# Patient Record
Sex: Male | Born: 1970 | Hispanic: No | Marital: Married | Smoking: Current every day smoker
Health system: Southern US, Community
[De-identification: ages and names within clinical notes are randomized; demographics above are authoritative.]

## PROBLEM LIST (undated history)

## (undated) DIAGNOSIS — F17200 Nicotine dependence, unspecified, uncomplicated: Secondary | ICD-10-CM

## (undated) HISTORY — DX: Nicotine dependence, unspecified, uncomplicated: F17.200

## (undated) HISTORY — PX: BACK SURGERY: SHX140

---

## 1998-01-03 ENCOUNTER — Emergency Department (HOSPITAL_COMMUNITY): Admission: EM | Admit: 1998-01-03 | Discharge: 1998-01-03 | Payer: Self-pay | Admitting: Emergency Medicine

## 1998-01-07 ENCOUNTER — Emergency Department (HOSPITAL_COMMUNITY): Admission: EM | Admit: 1998-01-07 | Discharge: 1998-01-07 | Payer: Self-pay | Admitting: Emergency Medicine

## 1998-01-14 ENCOUNTER — Emergency Department (HOSPITAL_COMMUNITY): Admission: EM | Admit: 1998-01-14 | Discharge: 1998-01-14 | Payer: Self-pay | Admitting: Emergency Medicine

## 1999-10-09 ENCOUNTER — Encounter: Admission: RE | Admit: 1999-10-09 | Discharge: 1999-10-09 | Payer: Self-pay | Admitting: Family Medicine

## 1999-10-09 ENCOUNTER — Encounter: Payer: Self-pay | Admitting: Family Medicine

## 2002-01-30 ENCOUNTER — Encounter: Payer: Self-pay | Admitting: *Deleted

## 2002-01-30 ENCOUNTER — Ambulatory Visit (HOSPITAL_COMMUNITY): Admission: RE | Admit: 2002-01-30 | Discharge: 2002-01-30 | Payer: Self-pay | Admitting: *Deleted

## 2005-04-03 ENCOUNTER — Ambulatory Visit (HOSPITAL_COMMUNITY): Admission: RE | Admit: 2005-04-03 | Discharge: 2005-04-03 | Payer: Self-pay | Admitting: Orthopedic Surgery

## 2005-04-24 ENCOUNTER — Ambulatory Visit (HOSPITAL_COMMUNITY): Admission: RE | Admit: 2005-04-24 | Discharge: 2005-04-26 | Payer: Self-pay | Admitting: Orthopedic Surgery

## 2010-07-14 ENCOUNTER — Ambulatory Visit: Payer: Self-pay | Admitting: Cardiology

## 2010-11-07 NOTE — Op Note (Signed)
NAME:  Jared Davis, Jared Davis                       ACCOUNT NO.:  [id number]   MEDICAL RECORD NO.:  [id number]          PATIENT TYPE:  OIB   LOCATION:  1510                         FACILITY:  [facility]   PHYSICIAN:  Georges Lynch. 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   SURGEON:  Georges Lynch. Darrelyn Hillock, M.D.   ASSISTANT:  Marlowe Kays, M.D.   OPERATION:  Hemilaminectomy and microdiskectomy at L4-5 on the left.   DESCRIPTION OF PROCEDURE:  Under general anesthesia, a routine orthopedic  prep and draping of the lower back was carried out. The patient had 1 gram  of IV Ancef. Two needles were placed in the back for localization purposes,  Trai x-ray was taken. At this time, Scot incision was made over the L4-5  interspace, bleeders identified and cauterized. The muscle was stripped from  the lamina and spinous process in the usual fashion. Two markers were placed  in the back, another x-ray was taken. Once we verified that we were in the  L4-5 space on the left, we then carried out a hemilaminectomy in the usual  fashion. We brought in the microscope,  removed the ligamentum flavum,  gently retracted the dura, identified the L5 root cauterized lateral recess  veins and then noted a large herniated disk at 4-5 on the left. A cruciate  incision was made into the disk in the posterior longitudinal ligament and  immediately disk material came out. We then went in and completed the  diskectomy. We made multiple passes into the disk space and into the  subligamentous area to  make sure there were no other free fragments and  there were none. I thoroughly irrigated out the area. We did a nice  foraminotomy as well. We were able to pass the hockey stick out the foramen  and all about the area to make sure there were no other specific disk  fragments. We thoroughly irrigated the area, cleaned the area out well and  then loosely applied some thrombin-soaked Gelfoam. We closed the wound in  layers in the usual fashion except the very deep proximal part of the wound  was partially left open for drainage purposes. The skin was closed with  metal staples and a sterile Neosporin dressing was applied. At the end of  the case, he had 30 mg of Toradol IV.           ______________________________  Georges Lynch. Darrelyn Hillock, M.D.     RAG/MEDQ  D:  04/24/2005  T:  04/24/2005  Job:  [id number]

## 2017-11-09 ENCOUNTER — Other Ambulatory Visit: Payer: Self-pay

## 2017-11-09 ENCOUNTER — Emergency Department (HOSPITAL_COMMUNITY): Payer: Commercial Managed Care - PPO

## 2017-11-09 ENCOUNTER — Emergency Department (HOSPITAL_COMMUNITY): Payer: Commercial Managed Care - PPO | Admitting: Certified Registered"

## 2017-11-09 ENCOUNTER — Encounter (HOSPITAL_COMMUNITY)
Admission: EM | Disposition: A | Discharge status: Home / Self Care | Payer: Self-pay | Source: Home / Self Care | Attending: Emergency Medicine

## 2017-11-09 ENCOUNTER — Observation Stay (HOSPITAL_COMMUNITY)
Admission: EM | Admit: 2017-11-09 | Discharge: 2017-11-11 | Disposition: A | Discharge status: Home / Self Care | Payer: Commercial Managed Care - PPO | Attending: Orthopedic Surgery | Admitting: Orthopedic Surgery

## 2017-11-09 ENCOUNTER — Encounter (HOSPITAL_COMMUNITY): Payer: Self-pay | Admitting: *Deleted

## 2017-11-09 DIAGNOSIS — Z23 Encounter for immunization: Secondary | ICD-10-CM | POA: Insufficient documentation

## 2017-11-09 DIAGNOSIS — W312XXA Contact with powered woodworking and forming machines, initial encounter: Secondary | ICD-10-CM | POA: Diagnosis not present

## 2017-11-09 DIAGNOSIS — S62394B Other fracture of fourth metacarpal bone, right hand, initial encounter for open fracture: Secondary | ICD-10-CM | POA: Diagnosis not present

## 2017-11-09 DIAGNOSIS — S62396B Other fracture of fifth metacarpal bone, right hand, initial encounter for open fracture: Secondary | ICD-10-CM | POA: Diagnosis not present

## 2017-11-09 DIAGNOSIS — S6291XA Unspecified fracture of right wrist and hand, initial encounter for closed fracture: Secondary | ICD-10-CM | POA: Diagnosis present

## 2017-11-09 DIAGNOSIS — Y92009 Unspecified place in unspecified non-institutional (private) residence as the place of occurrence of the external cause: Secondary | ICD-10-CM | POA: Diagnosis not present

## 2017-11-09 DIAGNOSIS — F17200 Nicotine dependence, unspecified, uncomplicated: Secondary | ICD-10-CM | POA: Diagnosis not present

## 2017-11-09 DIAGNOSIS — Y9389 Activity, other specified: Secondary | ICD-10-CM | POA: Insufficient documentation

## 2017-11-09 DIAGNOSIS — S62390B Other fracture of second metacarpal bone, right hand, initial encounter for open fracture: Principal | ICD-10-CM | POA: Insufficient documentation

## 2017-11-09 DIAGNOSIS — S6991XA Unspecified injury of right wrist, hand and finger(s), initial encounter: Secondary | ICD-10-CM | POA: Diagnosis present

## 2017-11-09 DIAGNOSIS — S61011A Laceration without foreign body of right thumb without damage to nail, initial encounter: Secondary | ICD-10-CM | POA: Diagnosis not present

## 2017-11-09 DIAGNOSIS — S62392B Other fracture of third metacarpal bone, right hand, initial encounter for open fracture: Secondary | ICD-10-CM | POA: Diagnosis not present

## 2017-11-09 HISTORY — PX: I & D EXTREMITY: SHX5045

## 2017-11-09 SURGERY — IRRIGATION AND DEBRIDEMENT EXTREMITY
Anesthesia: General | Site: Hand | Laterality: Right

## 2017-11-09 MED ORDER — SODIUM CHLORIDE 0.9 % IR SOLN
Status: DC | PRN
  Administered 2017-11-09 – 2017-11-10 (×4): 3000 mL

## 2017-11-09 MED ORDER — FENTANYL CITRATE (PF) 100 MCG/2ML IJ SOLN
INTRAMUSCULAR | Status: DC | PRN
  Administered 2017-11-09: 50 ug via INTRAVENOUS
  Administered 2017-11-09: 100 ug via INTRAVENOUS
  Administered 2017-11-09 – 2017-11-10 (×2): 50 ug via INTRAVENOUS

## 2017-11-09 MED ORDER — BUPIVACAINE HCL (PF) 0.25 % IJ SOLN
INTRAMUSCULAR | Status: AC
  Filled 2017-11-09: qty 30

## 2017-11-09 MED ORDER — DEXAMETHASONE SODIUM PHOSPHATE 10 MG/ML IJ SOLN
INTRAMUSCULAR | Status: DC | PRN
  Administered 2017-11-09: 10 mg via INTRAVENOUS

## 2017-11-09 MED ORDER — PROPOFOL 10 MG/ML IV BOLUS
INTRAVENOUS | Status: AC
  Filled 2017-11-09: qty 20

## 2017-11-09 MED ORDER — LIDOCAINE HCL (CARDIAC) PF 100 MG/5ML IV SOSY
PREFILLED_SYRINGE | INTRAVENOUS | Status: DC | PRN
  Administered 2017-11-09: 50 mg via INTRAVENOUS

## 2017-11-09 MED ORDER — MIDAZOLAM HCL 5 MG/5ML IJ SOLN
INTRAMUSCULAR | Status: DC | PRN
  Administered 2017-11-09: 2 mg via INTRAVENOUS

## 2017-11-09 MED ORDER — CEFAZOLIN SODIUM-DEXTROSE 1-4 GM/50ML-% IV SOLN
1.0000 g | Freq: Once | INTRAVENOUS | Status: AC
  Administered 2017-11-09: 1 g via INTRAVENOUS
  Filled 2017-11-09: qty 50

## 2017-11-09 MED ORDER — SUCCINYLCHOLINE CHLORIDE 20 MG/ML IJ SOLN
INTRAMUSCULAR | Status: DC | PRN
  Administered 2017-11-09: 120 mg via INTRAVENOUS

## 2017-11-09 MED ORDER — 0.9 % SODIUM CHLORIDE (POUR BTL) OPTIME
TOPICAL | Status: DC | PRN
  Administered 2017-11-09: 1000 mL

## 2017-11-09 MED ORDER — PHENYLEPHRINE HCL 10 MG/ML IJ SOLN
INTRAMUSCULAR | Status: DC | PRN
  Administered 2017-11-09: 80 ug via INTRAVENOUS
  Administered 2017-11-09: 40 ug via INTRAVENOUS
  Administered 2017-11-09: 80 ug via INTRAVENOUS
  Administered 2017-11-09: 40 ug via INTRAVENOUS

## 2017-11-09 MED ORDER — MIDAZOLAM HCL 2 MG/2ML IJ SOLN
INTRAMUSCULAR | Status: AC
  Filled 2017-11-09: qty 2

## 2017-11-09 MED ORDER — CEFAZOLIN SODIUM-DEXTROSE 1-4 GM/50ML-% IV SOLN
INTRAVENOUS | Status: DC | PRN
  Administered 2017-11-09: 1 g via INTRAVENOUS

## 2017-11-09 MED ORDER — TETANUS-DIPHTH-ACELL PERTUSSIS 5-2.5-18.5 LF-MCG/0.5 IM SUSP
0.5000 mL | Freq: Once | INTRAMUSCULAR | Status: AC
  Administered 2017-11-09: 0.5 mL via INTRAMUSCULAR
  Filled 2017-11-09: qty 0.5

## 2017-11-09 MED ORDER — LACTATED RINGERS IV SOLN
INTRAVENOUS | Status: DC | PRN
  Administered 2017-11-09 (×2): via INTRAVENOUS

## 2017-11-09 MED ORDER — FENTANYL CITRATE (PF) 250 MCG/5ML IJ SOLN
INTRAMUSCULAR | Status: AC
  Filled 2017-11-09: qty 5

## 2017-11-09 MED ORDER — PROPOFOL 10 MG/ML IV BOLUS
INTRAVENOUS | Status: DC | PRN
  Administered 2017-11-09: 140 mg via INTRAVENOUS

## 2017-11-09 MED ORDER — EPHEDRINE SULFATE 50 MG/ML IJ SOLN
INTRAMUSCULAR | Status: DC | PRN
  Administered 2017-11-09 (×2): 5 mg via INTRAVENOUS

## 2017-11-09 MED ORDER — ONDANSETRON HCL 4 MG/2ML IJ SOLN
INTRAMUSCULAR | Status: DC | PRN
  Administered 2017-11-09: 4 mg via INTRAVENOUS

## 2017-11-09 SURGICAL SUPPLY — 77 items
BANDAGE ACE 4X5 VEL STRL LF (GAUZE/BANDAGES/DRESSINGS) ×3 IMPLANT
BANDAGE ELASTIC 4 LF NS (GAUZE/BANDAGES/DRESSINGS) ×2 IMPLANT
BIT DRILL 2X3.5 HAND QK RELEAS (BIT) IMPLANT
BIT DRILL 2X3.5 QUICK RELEASE (BIT) ×3
BIT DRILL ACUTRAK 2 MINI (BIT) IMPLANT
BIT DRILL LNG QR ACUTRAK 2 (BIT) ×2 IMPLANT
BIT DRILL MINI LNG ACUTRAK 2 (BIT) IMPLANT
BNDG CMPR MED 5X4 ELC HKLP NS (GAUZE/BANDAGES/DRESSINGS) ×1
BNDG CONFORM 2 STRL LF (GAUZE/BANDAGES/DRESSINGS) IMPLANT
BNDG GAUZE ELAST 4 BULKY (GAUZE/BANDAGES/DRESSINGS) ×5 IMPLANT
CORDS BIPOLAR (ELECTRODE) ×3 IMPLANT
COVER SURGICAL LIGHT HANDLE (MISCELLANEOUS) ×3 IMPLANT
CUFF TOURNIQUET SINGLE 18IN (TOURNIQUET CUFF) ×3 IMPLANT
CUFF TOURNIQUET SINGLE 24IN (TOURNIQUET CUFF) IMPLANT
DRILL ACUTRAK 2 MINI (BIT) ×6
DRILL MINI LNG ACUTRAK 2 (BIT) ×3
DRSG ADAPTIC 3X8 NADH LF (GAUZE/BANDAGES/DRESSINGS) ×1 IMPLANT
GAUZE SPONGE 4X4 12PLY STRL (GAUZE/BANDAGES/DRESSINGS) ×3 IMPLANT
GAUZE XEROFORM 1X8 LF (GAUZE/BANDAGES/DRESSINGS) ×3 IMPLANT
GAUZE XEROFORM 5X9 LF (GAUZE/BANDAGES/DRESSINGS) ×2 IMPLANT
GLOVE BIOGEL M 8.0 STRL (GLOVE) ×1 IMPLANT
GLOVE SS BIOGEL STRL SZ 8 (GLOVE) ×1 IMPLANT
GLOVE SUPERSENSE BIOGEL SZ 8 (GLOVE) ×2
GOWN STRL REUS W/ TWL LRG LVL3 (GOWN DISPOSABLE) ×1 IMPLANT
GOWN STRL REUS W/ TWL XL LVL3 (GOWN DISPOSABLE) ×2 IMPLANT
GOWN STRL REUS W/TWL LRG LVL3 (GOWN DISPOSABLE) ×3
GOWN STRL REUS W/TWL XL LVL3 (GOWN DISPOSABLE) ×3
GUIDEWIRE ORTHO 0.054X7 SS (WIRE) ×2 IMPLANT
GUIDEWIRE ORTHO MINI ACTK .045 (WIRE) ×4 IMPLANT
KIT BASIN OR (CUSTOM PROCEDURE TRAY) ×3 IMPLANT
KIT TURNOVER KIT B (KITS) ×3 IMPLANT
MANIFOLD NEPTUNE II (INSTRUMENTS) ×3 IMPLANT
NDL HYPO 25GX1X1/2 BEV (NEEDLE) IMPLANT
NEEDLE HYPO 25GX1X1/2 BEV (NEEDLE) ×3 IMPLANT
NS IRRIG 1000ML POUR BTL (IV SOLUTION) ×3 IMPLANT
PACK ORTHO EXTREMITY (CUSTOM PROCEDURE TRAY) ×3 IMPLANT
PAD ARMBOARD 7.5X6 YLW CONV (MISCELLANEOUS) ×5 IMPLANT
PAD CAST 4YDX4 CTTN HI CHSV (CAST SUPPLIES) ×1 IMPLANT
PADDING CAST COTTON 4X4 STRL (CAST SUPPLIES) ×3
PADDING CAST SYNTHETIC 4 (CAST SUPPLIES) ×2
PADDING CAST SYNTHETIC 4X4 STR (CAST SUPPLIES) IMPLANT
PLATE STRAIGHT 10HOLE 1.3MM (Plate) ×2 IMPLANT
PUTTY DBM STAGRAFT PLUS 5CC (Putty) ×2 IMPLANT
SCREW ACUTRAK 2 MINI 30MM (Screw) ×2 IMPLANT
SCREW ACUTRAK 2 STD 32MM (Screw) ×2 IMPLANT
SCREW BONE LAG 2.3X10MM HEXA (Screw) IMPLANT
SCREW BONE LAG 2.3X9MM HEXAQ (Screw) IMPLANT
SCREW BONE LOCK 2.3X14MM HEXA (Screw) IMPLANT
SCREW LAG 2.3X10MM (Screw) ×3 IMPLANT
SCREW LAG 2.3X9MM (Screw) ×3 IMPLANT
SCREW LOCK 2.3X14 (Screw) ×3 IMPLANT
SCREW LOCK HEX MULTI 2.3X10 (Screw) ×2 IMPLANT
SCREW LOCK HEX MULTI 2.3X11 (Screw) ×2 IMPLANT
SCREW LOCK HEX MULTI 2.3X12 (Screw) ×2 IMPLANT
SCREW LOCK HEX MULTI 2.3X13 (Screw) ×2 IMPLANT
SCREW LOCK HEX MULTI 2.3X8 (Screw) ×2 IMPLANT
SCRUB BETADINE 4OZ XXX (MISCELLANEOUS) ×3 IMPLANT
SET CYSTO W/LG BORE CLAMP LF (SET/KITS/TRAYS/PACK) ×3 IMPLANT
SOL PREP POV-IOD 4OZ 10% (MISCELLANEOUS) ×3 IMPLANT
SPONGE LAP 4X18 X RAY DECT (DISPOSABLE) ×3 IMPLANT
STOCKINETTE 4X48 STRL (DRAPES) ×2 IMPLANT
SUT CHROMIC 4 0 P 3 18 (SUTURE) ×4 IMPLANT
SUT FIBER WIRE 4.0 (SUTURE) ×4 IMPLANT
SUT FIBERWIRE 3-0 18 DIAM 3/8 (SUTURE) ×3
SUT FIBERWIRE 3-0 18 TAPR NDL (SUTURE) ×6
SUT PROLENE 4 0 P 3 18 (SUTURE) ×4 IMPLANT
SUT PROLENE 4 0 PS 2 18 (SUTURE) ×10 IMPLANT
SUTURE FIBERWR 3-0 18 DIAM 3/8 (SUTURE) IMPLANT
SUTURE FIBERWR 3-0 18 TAPR NDL (SUTURE) IMPLANT
SWAB CULTURE ESWAB REG 1ML (MISCELLANEOUS) IMPLANT
SYR CONTROL 10ML LL (SYRINGE) ×2 IMPLANT
TOWEL OR 17X24 6PK STRL BLUE (TOWEL DISPOSABLE) ×3 IMPLANT
TOWEL OR 17X26 10 PK STRL BLUE (TOWEL DISPOSABLE) ×3 IMPLANT
TUBE CONNECTING 12'X1/4 (SUCTIONS) ×1
TUBE CONNECTING 12X1/4 (SUCTIONS) ×2 IMPLANT
WATER STERILE IRR 1000ML POUR (IV SOLUTION) ×1 IMPLANT
YANKAUER SUCT BULB TIP NO VENT (SUCTIONS) ×3 IMPLANT

## 2017-11-09 NOTE — Anesthesia Preprocedure Evaluation (Addendum)
Anesthesia Evaluation  Patient identified by MRN, date of birth, ID band Patient awake    Reviewed: Allergy & Precautions, NPO status , Patient's Chart, lab work & pertinent test results  Airway Mallampati: II  TM Distance: >3 FB Neck ROM: Full    Dental  (+) Teeth Intact, Dental Advisory Given   Pulmonary neg pulmonary ROS, Current Smoker,    breath sounds clear to auscultation       Cardiovascular negative cardio ROS   Rhythm:Regular Rate:Normal     Neuro/Psych negative neurological ROS  negative psych ROS   GI/Hepatic negative GI ROS, Neg liver ROS,   Endo/Other  negative endocrine ROS  Renal/GU negative Renal ROS     Musculoskeletal negative musculoskeletal ROS (+)   Abdominal Normal abdominal exam  (+)   Peds  Hematology negative hematology ROS (+)   Anesthesia Other Findings   Reproductive/Obstetrics                            No results found for: WBC, HGB, HCT, MCV, PLT   Anesthesia Physical Anesthesia Plan  ASA: II  Anesthesia Plan: General   Post-op Pain Management:    Induction: Intravenous, Rapid sequence and Cricoid pressure planned  PONV Risk Score and Plan: Ondansetron, Dexamethasone and Midazolam  Airway Management Planned: Oral ETT  Additional Equipment: None  Intra-op Plan:   Post-operative Plan: Extubation in OR  Informed Consent: I have reviewed the patients History and Physical, chart, labs and discussed the procedure including the risks, benefits and alternatives for the proposed anesthesia with the patient or authorized representative who has indicated his/her understanding and acceptance.   Dental advisory given  Plan Discussed with: CRNA  Anesthesia Plan Comments:         Anesthesia Quick Evaluation

## 2017-11-09 NOTE — H&P (Signed)
Jared Davis is Jared Davis 47 y.o. male.   Chief Complaint: Table saw injury right hand HPI: Patient is a 47 year old male who presents with a table saw injury to his right hand.  He notes no other injury.  He is here with his family.  He was at home working with the saw.  He is right-hand dominant.  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  Patient and I discussed all issues at length.  Patient notes no other injury.  I reviewed these issues with him at length and the relevant findings.  At present juncture I discussed him the relevant issues.  I discussed with him our plans for irrigation debridement and repair is necessary.  History reviewed. No pertinent past medical history.  History reviewed. No pertinent surgical history.  Patient has a history of back surgery greater than 10 years ago.  No family history on file. Social History:  has no tobacco, alcohol, and drug history on file.  Allergies: No Known Allergies   (Not in a hospital admission)  No results found for this or any previous visit (from the past 48 hour(s)). No results found.  Review of Systems  Respiratory: Negative.   Cardiovascular: Negative.   Gastrointestinal: Negative.   Genitourinary: Negative.     Blood pressure 113/69, pulse 76, temperature 98.2 F (36.8 C), resp. rate 16, SpO2 94 %. Physical Exam  Pleasant male with a right hand injury has bloodsoaked bandage with severe laceration across the dorsal aspect of his hand.  Patient and I have discussed this at length.  He is going to need acute/urgent surgical debridement.  It is difficult to evaluate the flexion and extension capabilities due to the severe pain.  I reviewed this with him at length and the findings.  Given all the issues I would recommend Sadie operative exploration and repair is necessary as well as intraoperative fluoroscopy to fix any osteochondral injury at the time of surgical intervention.  He understands this  and desires to proceed.  The patient is alert and oriented in no acute distress. The patient complains of pain in the affected upper extremity.  The patient is noted to have a normal HEENT exam. Lung fields show equal chest expansion and no shortness of breath. Abdomen exam is nontender without distention. Lower extremity examination does not show any fracture dislocation or blood clot symptoms. Pelvis is stable and the neck and back are stable and nontender. Assessment/Plan We will plan for irrigation debridement repair structures as necessary in hopes to salvage his hand.  We are planning surgery for your upper extremity. The risk and benefits of surgery to include risk of bleeding, infection, anesthesia,  damage to normal structures and failure of the surgery to accomplish its intended goals of relieving symptoms and restoring function have been discussed in detail. With this in mind we plan to proceed. I have specifically discussed with the patient the pre-and postoperative regime and the dos and don'ts and risk and benefits in great detail. Risk and benefits of surgery also include risk of dystrophy(CRPS), chronic nerve pain, failure of the healing process to go onto completion and other inherent risks of surgery The relavent the pathophysiology of the disease/injury process, as well as the alternatives for treatment and postoperative course of action has been discussed in great detail with the patient who desires to proceed.  We will do everything in our power to help you (the patient) restore function to the upper extremity. It  is a pleasure to see this patient today.   [name] III, MD 11/09/2017, 10:26 PM

## 2017-11-09 NOTE — ED Provider Notes (Signed)
[facility] EMERGENCY DEPARTMENT Provider Note   CSN: [id number] Arrival date & time: 11/09/17  2104     History   Chief Complaint Chief Complaint  Patient presents with  . Laceration    HPI Jared Davis is a 47 y.o. male.  Pt was using saw when he cut the dorsum of his right hand about 1 hour prior to arrival. Complex laceration with bones and tendons involved per EMS.  The history is provided by the patient.  Hand Injury   The incident occurred less than 1 hour ago. The incident occurred at home. Injury mechanism: wood saw. The pain is present in the right hand. The quality of the pain is described as throbbing. The pain is severe. The pain has been constant since the incident. Pertinent negatives include no fever. It is unknown if a foreign body is present. The symptoms are aggravated by movement. Treatments tried: Fentanyl with EMS. The treatment provided mild relief.    History reviewed. No pertinent past medical history.  There are no active problems to display for this patient.   History reviewed. No pertinent surgical history.      Home Medications    Prior to Admission medications   Not on File    Family History No family history on file.  Social History Social History   Tobacco Use  . Smoking status: Not on file  Substance Use Topics  . Alcohol use: Not on file  . Drug use: Not on file     Allergies   Patient has no known allergies.   Review of Systems Review of Systems  Constitutional: Negative for chills and fever.  HENT: Negative for ear pain and sore throat.   Eyes: Negative for pain and visual disturbance.  Respiratory: Negative for cough and shortness of breath.   Cardiovascular: Negative for chest pain and palpitations.  Gastrointestinal: Negative for abdominal pain and vomiting.  Genitourinary: Negative for dysuria and hematuria.  Musculoskeletal: Positive for arthralgias. Negative for back pain.  Skin: Positive  for wound. Negative for color change and rash.  Neurological: Negative for seizures and syncope.  All other systems reviewed and are negative.    Physical Exam Updated Vital Signs BP 113/69   Pulse 76   Temp 98.2 F (36.8 C)   Resp 16   SpO2 94%   Physical Exam  Constitutional: He appears well-developed and well-nourished.  HENT:  Head: Normocephalic and atraumatic.  Eyes: Conjunctivae are normal.  Neck: Neck supple.  Cardiovascular: Normal rate and regular rhythm.  No murmur heard. Pulmonary/Chest: Effort normal and breath sounds normal. No respiratory distress.  Abdominal: Soft. There is no tenderness.  Musculoskeletal: He exhibits tenderness and deformity. He exhibits no edema.  Complex injury to the right hand.  There is a large laceration extending from the second MCP through the fifth MCP of the dorsum of the hand.  It appears that all of the extensor tendons of the second through fifth digits are lacerated.  The fourth and fifth metacarpal heads are exposed.  There is a small amount of blood oozing which is controlled with direct pressure.  Neurological: He is alert.  Skin: Skin is warm and dry.  Psychiatric: He has a normal mood and affect.  Nursing note and vitals reviewed.    ED Treatments / Results  Labs (all labs ordered are listed, but only abnormal results are displayed) Labs Reviewed - No data to display  EKG None  Radiology Dg Hand Complete Right  Result Date: 11/09/2017 CLINICAL DATA:  Power tool injury to right hand. EXAM: RIGHT HAND - COMPLETE 3+ VIEW COMPARISON:  None. FINDINGS: Bandaging over the right hand obscures fine osseous detail. Within that limitation, there are comminuted fractures of the heads of the right second and fifth metacarpals. There is also probably a fracture of the head of the third metacarpal, though it is less clearly visible. Fourth metacarpal appears intact. IMPRESSION: Comminuted fractures of the heads of the right second and  fifth metacarpals. Suspected fracture of the head of the third metacarpal. Electronically Signed   By: [name] M.D.   On: 11/09/2017 22:32    Procedures Procedures (including critical care time)  Medications Ordered in ED Medications  sodium chloride irrigation 0.9 % (3,000 mLs  Given 11/09/17 2308)  0.9 % irrigation (POUR BTL) (1,000 mLs Irrigation Given 11/09/17 2254)  ceFAZolin (ANCEF) IVPB 1 g/50 mL premix (1 g Intravenous New Bag/Given 11/09/17 2148)  Tdap (BOOSTRIX) injection 0.5 mL (0.5 mLs Intramuscular Given 11/09/17 2148)     Initial Impression / Assessment and Plan / ED Course  I have reviewed the triage vital signs and the nursing notes.  Pertinent labs & imaging results that were available during my care of the patient were reviewed by me and considered in my medical decision making (see chart for details).    Patient is a 47 year old male who presents with a complex right hand laceration that occurred with a fall.  He has Jamond open fracture to his right hand with multiple metacarpals exposed as well as multiple extensor tendon lacerations.  Tdap and Ancef given.  X-ray shows fractures of the heads of the right second and fifth metacarpals.  His capillary refill in all of his fingers on the right hand are less than 2 seconds.  He has sensation distally in all of his digits.  Hand surgery consulted.  They will take patient directly to the OR for definitive management.  Final Clinical Impressions(s) / ED Diagnoses   Final diagnoses:  Hand injuries, right, initial encounter    ED Discharge Orders    None       Lennette Bihari, MD 11/09/17 2348    Nira Conn, MD 11/10/17 2109

## 2017-11-09 NOTE — Anesthesia Procedure Notes (Addendum)
Procedure Name: Intubation Date/Time: 11/09/2017 10:39 PM Performed by: Babs Bertin, CRNA Pre-anesthesia Checklist: Patient identified, Emergency Drugs available, Suction available and Patient being monitored Patient Re-evaluated:Patient Re-evaluated prior to induction Oxygen Delivery Method: Circle System Utilized Preoxygenation: Pre-oxygenation with 100% oxygen Induction Type: IV induction, Rapid sequence and Cricoid Pressure applied Laryngoscope Size: Mac and 3 Grade View: Grade I Tube type: Oral Tube size: 7.5 mm Number of attempts: 1 Airway Equipment and Method: Stylet and Oral airway Placement Confirmation: ETT inserted through vocal cords under direct vision,  positive ETCO2 and breath sounds checked- equal and bilateral Secured at: 21 cm Tube secured with: Tape Dental Injury: Teeth and Oropharynx as per pre-operative assessment

## 2017-11-09 NOTE — ED Triage Notes (Signed)
Pt arrived by EMS after cutting his R hand with a power saw. Significant lac to posterior hand with bone and tendon exposure; little finger nearly amputated. Limited range of motion; bleeding controlled at present. Unknown last tetanus; fentanyl given enroute.

## 2017-11-10 ENCOUNTER — Encounter (HOSPITAL_COMMUNITY): Payer: Self-pay | Admitting: *Deleted

## 2017-11-10 ENCOUNTER — Other Ambulatory Visit: Payer: Self-pay

## 2017-11-10 DIAGNOSIS — S6291XA Unspecified fracture of right wrist and hand, initial encounter for closed fracture: Secondary | ICD-10-CM | POA: Diagnosis present

## 2017-11-10 LAB — BASIC METABOLIC PANEL
ANION GAP: 7 (ref 5–15)
BUN: 11 mg/dL (ref 6–20)
CO2: 28 mmol/L (ref 22–32)
Calcium: 8.5 mg/dL — ABNORMAL LOW (ref 8.9–10.3)
Chloride: 102 mmol/L (ref 101–111)
Creatinine, Ser: 0.71 mg/dL (ref 0.61–1.24)
GFR calc Af Amer: >60 mL/min (ref >60)
Glucose, Bld: 173 mg/dL — ABNORMAL HIGH (ref 65–99)
POTASSIUM: 4.1 mmol/L (ref 3.5–5.1)
Sodium: 137 mmol/L (ref 135–145)

## 2017-11-10 LAB — CBC WITH DIFFERENTIAL/PLATELET
Abs Immature Granulocytes: 0.1 10*3/uL (ref 0.0–0.1)
BASOS PCT: 0 %
Basophils Absolute: 0 10*3/uL (ref 0.0–0.1)
EOS ABS: 0 10*3/uL (ref 0.0–0.7)
EOS PCT: 0 %
HCT: 39.2 % (ref 39.0–52.0)
Hemoglobin: 12.8 g/dL — ABNORMAL LOW (ref 13.0–17.0)
IMMATURE GRANULOCYTES: 0 %
Lymphocytes Relative: 6 %
Lymphs Abs: 0.8 10*3/uL (ref 0.7–4.0)
MCH: 27.9 pg (ref 26.0–34.0)
MCHC: 32.7 g/dL (ref 30.0–36.0)
MCV: 85.6 fL (ref 78.0–100.0)
Monocytes Absolute: 0.1 10*3/uL (ref 0.1–1.0)
Monocytes Relative: 1 %
NEUTROS PCT: 93 %
Neutro Abs: 11.5 10*3/uL — ABNORMAL HIGH (ref 1.7–7.7)
PLATELETS: 352 10*3/uL (ref 150–400)
RBC: 4.58 MIL/uL (ref 4.22–5.81)
RDW: 14.5 % (ref 11.5–15.5)
WBC: 12.5 10*3/uL — AB (ref 4.0–10.5)

## 2017-11-10 MED ORDER — GENTAMICIN SULFATE 40 MG/ML IJ SOLN
400.0000 mg | INTRAVENOUS | Status: AC
  Administered 2017-11-10 – 2017-11-11 (×2): 400 mg via INTRAVENOUS
  Filled 2017-11-10 (×2): qty 10

## 2017-11-10 MED ORDER — OXYCODONE HCL 5 MG PO TABS
5.0000 mg | ORAL_TABLET | ORAL | Status: DC | PRN
  Administered 2017-11-10 – 2017-11-11 (×2): 10 mg via ORAL
  Filled 2017-11-10 (×2): qty 2

## 2017-11-10 MED ORDER — LACTATED RINGERS IV SOLN
INTRAVENOUS | Status: DC
  Administered 2017-11-10: 10:00:00 via INTRAVENOUS

## 2017-11-10 MED ORDER — PROMETHAZINE HCL 25 MG/ML IJ SOLN: 6.2500 mg | INTRAMUSCULAR | Status: DC | PRN

## 2017-11-10 MED ORDER — ONDANSETRON HCL 4 MG/2ML IJ SOLN: 4.0000 mg | Freq: Four times a day (QID) | INTRAMUSCULAR | Status: DC | PRN

## 2017-11-10 MED ORDER — LACTATED RINGERS IV SOLN: INTRAVENOUS | Status: DC

## 2017-11-10 MED ORDER — PNEUMOCOCCAL VAC POLYVALENT 25 MCG/0.5ML IJ INJ
0.5000 mL | INJECTION | INTRAMUSCULAR | Status: AC
  Administered 2017-11-11: 0.5 mL via INTRAMUSCULAR
  Filled 2017-11-10: qty 0.5

## 2017-11-10 MED ORDER — VITAMIN C 500 MG PO TABS
1000.0000 mg | ORAL_TABLET | Freq: Every day | ORAL | Status: DC
  Administered 2017-11-10 – 2017-11-11 (×2): 1000 mg via ORAL
  Filled 2017-11-10 (×2): qty 2

## 2017-11-10 MED ORDER — DOCUSATE SODIUM 100 MG PO CAPS
100.0000 mg | ORAL_CAPSULE | Freq: Two times a day (BID) | ORAL | Status: DC
  Administered 2017-11-10 – 2017-11-11 (×3): 100 mg via ORAL
  Filled 2017-11-10 (×3): qty 1

## 2017-11-10 MED ORDER — HYDROMORPHONE HCL 2 MG/ML IJ SOLN
INTRAMUSCULAR | Status: AC
Start: 2017-11-10 — End: 2017-11-10
  Filled 2017-11-10: qty 1

## 2017-11-10 MED ORDER — ONDANSETRON HCL 4 MG PO TABS: 4.0000 mg | ORAL_TABLET | Freq: Four times a day (QID) | ORAL | Status: DC | PRN

## 2017-11-10 MED ORDER — CEFAZOLIN SODIUM-DEXTROSE 1-4 GM/50ML-% IV SOLN
1.0000 g | Freq: Three times a day (TID) | INTRAVENOUS | Status: DC
  Administered 2017-11-10 – 2017-11-11 (×3): 1 g via INTRAVENOUS
  Filled 2017-11-10 (×4): qty 50

## 2017-11-10 MED ORDER — MEPERIDINE HCL 50 MG/ML IJ SOLN: 6.2500 mg | INTRAMUSCULAR | Status: DC | PRN

## 2017-11-10 MED ORDER — CEFAZOLIN SODIUM-DEXTROSE 1-4 GM/50ML-% IV SOLN
1.0000 g | INTRAVENOUS | Status: AC
  Administered 2017-11-10: 1 g via INTRAVENOUS
  Filled 2017-11-10: qty 50

## 2017-11-10 MED ORDER — MORPHINE SULFATE (PF) 2 MG/ML IV SOLN
1.0000 mg | INTRAVENOUS | Status: DC | PRN
  Administered 2017-11-10: 1 mg via INTRAVENOUS
  Filled 2017-11-10: qty 1

## 2017-11-10 MED ORDER — HYDROMORPHONE HCL 2 MG/ML IJ SOLN
0.2500 mg | INTRAMUSCULAR | Status: DC | PRN
  Administered 2017-11-10 (×2): 0.5 mg via INTRAVENOUS

## 2017-11-10 MED ORDER — METHOCARBAMOL 500 MG PO TABS
500.0000 mg | ORAL_TABLET | Freq: Four times a day (QID) | ORAL | Status: DC | PRN
  Administered 2017-11-10 – 2017-11-11 (×2): 500 mg via ORAL
  Filled 2017-11-10 (×2): qty 1

## 2017-11-10 MED ORDER — FAMOTIDINE 20 MG PO TABS: 20.0000 mg | ORAL_TABLET | Freq: Two times a day (BID) | ORAL | Status: DC | PRN

## 2017-11-10 MED ORDER — METHOCARBAMOL 1000 MG/10ML IJ SOLN: 500.0000 mg | Freq: Four times a day (QID) | INTRAVENOUS | Status: DC | PRN

## 2017-11-10 MED ORDER — PROMETHAZINE HCL 12.5 MG RE SUPP
12.5000 mg | Freq: Four times a day (QID) | RECTAL | Status: DC | PRN
  Filled 2017-11-10: qty 1

## 2017-11-10 MED ORDER — ALPRAZOLAM 0.5 MG PO TABS: 0.5000 mg | ORAL_TABLET | Freq: Four times a day (QID) | ORAL | Status: DC | PRN

## 2017-11-10 NOTE — Anesthesia Postprocedure Evaluation (Signed)
Anesthesia Post Note  Patient: Jared Davis  Procedure(s) Performed: IRRIGATION AND DEBRIDEMENT RIGHT HAND REPAIR AS NECESSARY (Right Hand)     Patient location during evaluation: PACU Anesthesia Type: General Level of consciousness: awake and alert Pain management: pain level controlled Vital Signs Assessment: post-procedure vital signs reviewed and stable Respiratory status: spontaneous breathing, nonlabored ventilation, respiratory function stable and patient connected to nasal cannula oxygen Cardiovascular status: blood pressure returned to baseline and stable Postop Assessment: no apparent nausea or vomiting Anesthetic complications: no    Last Vitals:  Vitals:   11/10/17 0205 11/10/17 0232  BP: 116/73 118/76  Pulse: 96 93  Resp: 16 16  Temp: 36.7 C 36.8 C  SpO2: 98% 96%    Last Pain:  Vitals:   11/10/17 0241  TempSrc:   PainSc: 0-No pain                 Shelton Silvas

## 2017-11-10 NOTE — Transfer of Care (Signed)
Immediate Anesthesia Transfer of Care Note  Patient: Jared Davis  Procedure(s) Performed: IRRIGATION AND DEBRIDEMENT RIGHT HAND REPAIR AS NECESSARY (Right Hand)  Patient Location: PACU  Anesthesia Type:General  Level of Consciousness: awake, alert  and patient cooperative  Airway & Oxygen Therapy: Patient Spontanous Breathing  Post-op Assessment: Report given to RN and Post -op Vital signs reviewed and stable  Post vital signs: Reviewed and stable  Last Vitals:  Vitals Value Taken Time  BP 125/76 11/10/2017  1:35 AM  Temp 36.5 C 11/10/2017  1:35 AM  Pulse 108 11/10/2017  1:39 AM  Resp 20 11/10/2017  1:39 AM  SpO2 93 % 11/10/2017  1:39 AM  Vitals shown include unvalidated device data.  Last Pain:  Vitals:   11/09/17 2117  PainSc: 10-Worst pain ever         Complications: No apparent anesthesia complications

## 2017-11-10 NOTE — Progress Notes (Signed)
Pharmacy Antibiotic Note  Jared Davis is a 47 y.o. male admitted on 11/09/2017 with table saw injury to his right hand.  Pharmacy has been consulted for post-op gentamicin dosing x36 hours.  Plan: Gentamicin  IV Q24H x2 doses.  Height:  (165.1 cm) Weight: 134 lb (60.8 kg) IBW/kg (Calculated) : 61.5  Temp (24hrs), Avg:98.1 F (36.7 C), Min:97.7 F (36.5 C), Max:98.2 F (36.8 C)  No Known Allergies   Thank you for allowing pharmacy to be a part of this patient's care.  Vernard Gambles, PharmD, BCPS  11/10/2017 2:43 AM

## 2017-11-10 NOTE — Op Note (Signed)
SP reconstruction right hand tablesaw injury involving Index Middle Ring and Small fingers See dict # [id number] Ioan Landini MD

## 2017-11-11 ENCOUNTER — Encounter (HOSPITAL_COMMUNITY): Payer: Self-pay | Admitting: Orthopedic Surgery

## 2017-11-11 NOTE — Discharge Instructions (Signed)
Dr. Carlos Levering office will call for your follow-up  Please keep your bandage clean and dry.  Please elevate as much as possible and move the fingers gently as instructed.  No smoking   Keep bandage clean and dry.  Call for any problems.  No smoking.  Criteria for driving a car: you should be off your pain medicine for 7-8 hours, able to drive one handed(confident), thinking clearly and feeling able in your judgement to drive. Continue elevation as it will decrease swelling.  If instructed by MD move your fingers within the confines of the bandage/splint.  Use ice if instructed by your MD. Call immediately for any sudden loss of feeling in your hand/arm or change in functional abilities of the extremity.We recommend that you to take vitamin C 1000 mg a day to promote healing. We also recommend that if you require  pain medicine that you take a stool softener to prevent constipation as most pain medicines will have constipation side effects. We recommend either Peri-Colace or Senokot and recommend that you also consider adding MiraLAX as well to prevent the constipation affects from pain medicine if you are required to use them. These medicines are over the counter and may be purchased at a local pharmacy. A cup of yogurt and a probiotic can also be helpful during the recovery process as the medicines can disrupt your intestinal environment.

## 2017-11-11 NOTE — Progress Notes (Signed)
AVS given and reviewed with pt and pt's wife. Pneumococcal vaccine given prior to d/c. Pt and wife said they did not need interpreter for d/c paperwork. Handwritten prescriptions from [facility], MD given to pt. All questions answered to satisfaction. Pt to be escorted off the unit via wheelchair by volunteer services.

## 2017-11-11 NOTE — Progress Notes (Signed)
Patient ID: Jared Davis, male   DOB: 08/02/1970, 47 y.o.   MRN: [id number] Patient is stable and ready for discharge.  Please see discharge summary.  Final diagnosis status post reconstruction table saw injury right hand  Jared Feenstra MD

## 2017-11-11 NOTE — Op Note (Signed)
NAME: Nastasi, [facility] MEDICAL RECORD [id number] ACCOUNT [id number] DATE OF BIRTH:11-10-1970 FACILITY: [facility] LOCATION: [facility] PHYSICIAN:Kerie Badger M. Tanner Yeley, MD  OPERATIVE REPORT  DATE OF PROCEDURE:    PREOPERATIVE DIAGNOSES: 1.  Table saw injury, right hand, with open fractures of the index, middle, ring and small fingers, obliterating the extensor tendon apparatus and metacarpophalangeal regions of the index, middle, and small finger, as well as severe metacarpophalangeal  injury to the ring finger. 2.  Laceration, right thumb.  POSTOPERATIVE DIAGNOSES: 1.  Table saw injury, right hand, with open fractures of the index, middle, ring and small fingers, obliterating the extensor tendon apparatus and metacarpophalangeal regions of the index, middle, and small finger, as well as severe metacarpophalangeal  injury to the ring finger. 2.  Laceration, right thumb.  PROCEDURES: 1.  Irrigation and debridement of skin, subcutaneous tissue, bone, tendon, muscle and associated soft tissues right hand to include index finger metacarpophalangeal joint which was obliterated and destroyed, middle finger metacarpophalangeal joint which  was obliterated and destroyed, ring finger metacarpophalangeal joint, small finger metacarpophalangeal joint which was obliterated and destroyed. 2.  Fusion with Acumed hand fracture plate, right index finger, with autologous bone graft and allograft. 3.  Fusion, right middle finger, with autologous bone graft and allograft utilizing a 32 mm Acutrak screw, standard. 4.  Open treatment right ring finger metacarpophalangeal fracture with bone grafting of a cavitary defect just behind the metacarpophalangeal. 5.  Fusion, right small finger with mini-Acutrak screw 30 mm in length. 6.  Radial collateral ligament repair, index finger. 7.  Superficial radial nerve neuroma burying procedure, right hand. 8.  Dorsal sensory branch ulnar nerve bearing procedure, right hand. 9.   Extensor digitorum communis and extensor indices proprius tendon repair, index finger. 10.  Extensor digitorum communis repair, middle finger. 11.  Extensor digitorum communis repair, ring finger. 12.  Extensor digitorum communis and extensor indices proprius repair, right small finger. 13.  Complex rotation flap closure to the hand. 14.  I and D skin and subcutaneous tissue and closure of 1 cm wound, right thumb. 15.  Ten-view x-ray series performed and examined and interpreted by myself.   SURGEON:  [name], MD  ASSISTANT:  None.  COMPLICATIONS:  None.  ANESTHESIA:  General.  TOURNIQUET TIME:  Approximately 2 hours.  INDICATIONS:  A 47 year old male with severe injury to his right hand tonight.  He was working on a home project, and a saw went through the top of his right hand.  This obliterated the MCP joint of the index, middle and small fingers with open extensor  tendon laceration, soft tissue disarray skin disruption, and dorsal sensory nerve injury.  The ring finger had severe injury but maintained articular continuity about the MCP region with open fracture noted.  Unfortunately, the extensor tendon apparatus  was completely destroyed.  The patient also had a small laceration over the thumb.  I discussed with him and his family all issues, risks and benefits, dos and don'ts, timeframe and duration of recovery and options.  DESCRIPTION OF PROCEDURE:  The patient was bleeding severely, and thus was taken emergently up to the operative theater once seen in the ER.  Once this was complete, we then very carefully and cautiously put him under a general anesthetic.  After arm was marked, timeout was called and all counseling was complete, the patient underwent anesthesia.  Under general anesthetic, I then removed his bandage and performed a Hibiclens scrub x2 as a prescrub, followed by 10-minute surgical Betadine  scrub and paint.  Outline landmarks were made, and at this time the  bleeding was significant enough that we went ahead and inflated the tourniquet.  Once this was complete, we then set out on Abdirahman irrigation and debridement.  I performed x-ray series.  This was a live fluoroscopy and 10-view x-ray series to evaluate the injuries.  Once x-rays were taken, the patient's bony injury was realized.  At this  time, we then set forth with irrigation and debridement.  I performed irrigation and debridement with a curette, knife and scissor of the index finger, middle finger, ring finger and small finger MCP joints.  At this time, 6 L of saline were placed  through and through wound, and excisional debridement of muscle, tendon and associated soft tissue was performed.  I removed a rim of tissue about the skin edges well to freshen up this area as it was very jagged.  The patient had a meticulous I and D  performed.  I removed any particulate matter.  The patient had significant bony injury and destruction.  Portions of the bone were saved in a cup and later used for autologous bone graft with allograft of the stay graft variety as well.  We spent a great deal of time simply assessing the situation and trying to best understand his abilities.  At this time, I noted the ligamentous architecture of the small finger and index finger were completely devoid of any meaningful stability, and he  had a segmental defect in the index finger.  It was quite apparent the index finger, small finger and middle finger would be best served with fusions.  I considered arthroplasty; however, he really did not have collateral ligament or soft tissue sleeve  which would allow this to be a successful procedure in my estimation.  Fortunately, the MCP joint of the ring finger had some degree of articular continuity, and thus we did not have to perform a primary fusion here.  At this juncture, I first turned towards the small finger.  I prepared the ends of the proximal phalanx and  metacarpal head region  and then placed a 30 mm mini-Acutrak screw for fusion purposes.  I was very careful in setting the patient in slight flexion of course at the MCP joint for a position of function.  I did perform Cosby evaluation of the volar  structures and noted the FDP to be intact, and with traction check, it looked well.  This fusion went without difficulty.  Following this, I then turned attention towards the index finger.  This was Garrick additional border digit besides the small finger which had complete instability.  I performed a very careful and cautious irrigation and debridement, followed by application  of the long hand fracture plate prebent at approximately 30-35 degrees.  I lined this up and achieved 8 cortices proximally and distally, followed by placement of StaGraft and autologous bone graft in portions of the dorsal void.  Overall, he had a good  look, excellent splay, and I spent a great deal of time making sure this would look normal in terms of his finger splay when flexed and extended.  I packed autologous bone graft in this area.  This was a fusion of the right index finger MCP joint with autologous and allograft bone graft.  Following this, attention was turned towards the right middle finger.  Similarly, cup and cone preparation was accomplished followed by placement of a 32 standard Acutrak screw which allowed coaptation of  cancellous surfaces under compression.  I did  bone graft this area also with autologous and allograft bone graft.  Following this, the ring finger was dressed with open treatment of the metacarpal fracture with bone grafting at the void.  Unfortunately, he had continuity and stability about the articular surface.  I then irrigated copiously.  At this time, I then performed bearing technique of the superficial branch of the radial nerve and its divisions which were fairly mutilated.  The patient then had barium procedure of the dorsal sensory branch of the ulnar  nerve.  The  patient had 2 separate barium procedure-type measures performed given the injuries.  This was simply unable to be considered for primary repair, and thus the area was debrided and the nerves were allowed to be retracted and buried in simply  soft tissues.  I expect numbness over the top of his hand.  Once this was complete, the radial collateral ligament of the index finger was repaired with suture.  Following this, I then performed sequential extensor tendon repair of the index finger, followed by middle finger, followed by ring finger, followed by small finger.  The small finger underwent repair of the EDC and EIP tendon with FiberWire.  The index finger underwent repair of the EIP and the EDC tendon.  FiberWire of the 3 on 4 variety were used for this.  There were no complicating features.  Following this, the middle finger extensor digitorum communis/EDC was repaired with FiberWire.   Following this, the EDC to the ring finger was repaired, and this had to be centralized as it was very jagged, frayed, and poorly conditioned.  Following the tendon repairs, we then very carefully and copiously irrigated, followed by final copy x-rays which were performed, examined, and interpreted by myself, followed by a very careful closure of the skin.  I utilized portions of rotation flap  technique to perform the closure, and rotation flap technique allowed for direct repair.  During the course of the operation, I made some extensions on the index finger and in the hand.  These were attended to with primary closure.  The patient did undergo a right thumb dorsal laceration I and D of skin, subcutaneous tissue and closure with Prolene.  This was a simple closure and a separate laceration.  Tourniquet time was less than 2 hours.  There were no complicating features.  He had good pink fingers.  Splay looked good.  He was placed in a modified dressing and splint, taken to recovery room in stable condition.  I  will ask for a Carter arm pillow for elevation, IV antibiotics including vancomycin and gentamicin initially.  Going forward, I feel that we will need to keep him still for 12 to 14 days, then remove his sutures and get him into a therapy regime or we can begin IP motion about the DIP and PIP joints very gently and keep the MCP joints completely still.  We want  to make sure he goes on to heal this, and I think this will be the biggest obstacle.  He has a high risk of problems.  I should note prior to closure he did have Tytan additional 3 L of saline placed through and through the wounds and also a liter from the table.  This was a total of 10 L placed through the wound to try and afford decreased infection rate.  Once again, our therapeutic endeavors will be flexion and extension of the fingers about the PIP and  DIP joints, keeping the MCPs free.  The thumb can move normally.  We want to watch him closely.  No strengthening until fusion is stable, and our goal is  simply to give him his motion back and get the strength back later once the fusions are complete and solid.  Once again, very significant injury, and I do feel he will have some permanent impairment from this in my opinion.  We have discussed these issues at length.  It was a pleasure seeing him today and participate in his care.  We are going to do everything  we can for this gentleman who has such Haroon unfortunate injury to his right-hand dominant right hand.  I discussed this with his family, including his wife, at great length and all questions have been addressed.  LN/NUANCE  D:11/10/2017 T:11/10/2017 JOB:[id number]

## 2017-11-11 NOTE — Discharge Summary (Signed)
Physician Discharge Summary  Patient ID: Jared Davis MRN: [id number] DOB/AGE: 1970-07-07 47 y.o.  Admit date: 11/09/2017 Discharge date:   Admission Diagnoses: right hand injury Past Medical History:  Diagnosis Date  . Smoker     Discharge Diagnoses:  Active Problems:   Hand fracture, right   Surgeries: Procedure(s): IRRIGATION AND DEBRIDEMENT RIGHT HAND REPAIR AS NECESSARY on 11/09/2017 - 11/10/2017    Consultants:   Discharged Condition: Improved  Hospital Course: Jared Davis is Demarqus 47 y.o. male who was admitted 11/09/2017 with a chief complaint of  Chief Complaint  Patient presents with  . Laceration  , and found to have a diagnosis of right hand injury.  They were brought to the operating room on 11/09/2017 - 11/10/2017 and underwent Procedure(s): IRRIGATION AND DEBRIDEMENT RIGHT HAND REPAIR AS NECESSARY.    They were given perioperative antibiotics:  Anti-infectives (From admission, onward)   Start     Dose/Rate Route Frequency Ordered Stop   11/10/17 1200  ceFAZolin (ANCEF) IVPB 1 g/50 mL premix     1 g 100 mL/hr over 30 Minutes Intravenous Every 8 hours 11/10/17 0232     11/10/17 0400  ceFAZolin (ANCEF) IVPB 1 g/50 mL premix     1 g 100 mL/hr over 30 Minutes Intravenous NOW 11/10/17 0232 11/10/17 0500   11/10/17 0400  gentamicin (GARAMYCIN) 400 mg in dextrose 5 % 100 mL IVPB     400 mg 110 mL/hr over 60 Minutes Intravenous Every 24 hours 11/10/17 0246 11/11/17 0638   11/09/17 2130  ceFAZolin (ANCEF) IVPB 1 g/50 mL premix     1 g 100 mL/hr over 30 Minutes Intravenous  Once 11/09/17 2123 11/09/17 2230    .  They were given sequential compression devices, early ambulation, and Other (comment) for DVT prophylaxis.  Recent vital signs:  Patient Vitals for the past 24 hrs:  BP Temp Temp src Pulse Resp SpO2  11/11/17 0522 113/73 97.9 F (36.6 C) Oral 68 16 99 %  11/10/17 2022 106/64 98.3 F (36.8 C) Oral 75 18 93 %  11/10/17 1518 104/64 97.8 F (36.6 C) Oral 68 20  96 %  11/10/17 0931 (!) 152/114 (!) 97.3 F (36.3 C) Oral 92 - 97 %  .  Recent laboratory studies: Dg Hand Complete Right  Result Date: 11/09/2017 CLINICAL DATA:  Power tool injury to right hand. EXAM: RIGHT HAND - COMPLETE 3+ VIEW COMPARISON:  None. FINDINGS: Bandaging over the right hand obscures fine osseous detail. Within that limitation, there are comminuted fractures of the heads of the right second and fifth metacarpals. There is also probably a fracture of the head of the third metacarpal, though it is less clearly visible. Fourth metacarpal appears intact. IMPRESSION: Comminuted fractures of the heads of the right second and fifth metacarpals. Suspected fracture of the head of the third metacarpal. Electronically Signed   By: Jared Davis M.D.   On: 11/09/2017 22:32    Discharge Medications:   Allergies as of 11/11/2017   No Known Allergies     Medication List    You have not been prescribed any medications.     Diagnostic Studies: Dg Hand Complete Right  Result Date: 11/09/2017 CLINICAL DATA:  Power tool injury to right hand. EXAM: RIGHT HAND - COMPLETE 3+ VIEW COMPARISON:  None. FINDINGS: Bandaging over the right hand obscures fine osseous detail. Within that limitation, there are comminuted fractures of the heads of the right second and fifth metacarpals. There is also probably  a fracture of the head of the third metacarpal, though it is less clearly visible. Fourth metacarpal appears intact. IMPRESSION: Comminuted fractures of the heads of the right second and fifth metacarpals. Suspected fracture of the head of the third metacarpal. Electronically Signed   By: Jared Davis M.D.   On: 11/09/2017 22:32    They benefited maximally from their hospital stay and there were no complications.     Disposition: Discharge disposition: 01-Home or Self Care      Discharge Instructions    Call MD / Call 911   Complete by:  As directed    If you experience chest pain or  shortness of breath, CALL 911 and be transported to the hospital emergency room.  If you develope a fever above 101 F, pus (white drainage) or increased drainage or redness at the wound, or calf pain, call your surgeon's office.   Constipation Prevention   Complete by:  As directed    Drink plenty of fluids.  Prune juice may be helpful.  You may use a stool softener, such as Colace (over the counter) 100 mg twice a day.  Use MiraLax (over the counter) for constipation as needed.   Diet - low sodium heart healthy   Complete by:  As directed    Increase activity slowly as tolerated   Complete by:  As directed      Follow-up Information    [name], MD Follow up in 14 day(s).   Specialty:  Orthopedic Surgery Why:  we will call for your follow up Contact information: [address] [place] [address] [phone]            status post table saw injury right hand requiring reconstruction including MCP joints and extensor apparatus as well as ligamentous and nerve repair reconstruction.  At present time the patient is awake stable and doing well.  Discharge medicines Keflex 500 4 times daily x10 days and OxyIR 1 p.o. every 6-8 hours as needed pain  Patient will elevate.  We have encouraged no smoking.  I will see him in 14 days.  He will be out of work until further notice and I did write a detailed work note.  I have called his wife and discussed with her and a verbal discussion at bedside with the patient was given.  All instructions are noted.  It was a pleasure to see him.  I do feel he will have some degree of permanent disability with this type injury process as noted in the operative note  Signed: [name] III 11/11/2017, 6:50 AM

## 2017-12-03 ENCOUNTER — Encounter (HOSPITAL_COMMUNITY): Payer: Self-pay | Admitting: Orthopedic Surgery

## 2018-07-14 ENCOUNTER — Other Ambulatory Visit: Payer: Self-pay | Admitting: Orthopedic Surgery

## 2018-07-14 DIAGNOSIS — M79641 Pain in right hand: Secondary | ICD-10-CM

## 2018-07-19 ENCOUNTER — Inpatient Hospital Stay: Admission: RE | Admit: 2018-07-19 | Payer: Commercial Managed Care - PPO | Source: Ambulatory Visit

## 2018-07-20 ENCOUNTER — Ambulatory Visit
Admission: RE | Admit: 2018-07-20 | Discharge: 2018-07-20 | Disposition: A | Discharge status: Home / Self Care | Payer: Medicaid Other | Source: Ambulatory Visit | Attending: Orthopedic Surgery | Admitting: Orthopedic Surgery

## 2018-07-20 DIAGNOSIS — M79641 Pain in right hand: Secondary | ICD-10-CM

## 2019-01-06 ENCOUNTER — Other Ambulatory Visit (HOSPITAL_COMMUNITY)
Admission: RE | Admit: 2019-01-06 | Discharge: 2019-01-06 | Disposition: A | Discharge status: Home / Self Care | Payer: Medicaid Other | Source: Ambulatory Visit | Attending: Orthopedic Surgery | Admitting: Orthopedic Surgery

## 2019-01-06 DIAGNOSIS — Z1159 Encounter for screening for other viral diseases: Secondary | ICD-10-CM | POA: Insufficient documentation

## 2019-01-06 LAB — SARS CORONAVIRUS 2 (TAT 6-24 HRS): SARS Coronavirus 2: NEGATIVE

## 2019-01-09 ENCOUNTER — Encounter (HOSPITAL_COMMUNITY): Payer: Self-pay | Admitting: *Deleted

## 2019-01-09 NOTE — Progress Notes (Signed)
Preop phone call completed with assistance of [place] [phone]. Patient speaks [place]. Denies chest pain or shortness of breath. Sent request for in person interpreter. States he has been complying with quarantine instructions post COVID test. Notified of visitor restrictions.

## 2019-01-10 ENCOUNTER — Ambulatory Visit (HOSPITAL_COMMUNITY): Payer: Medicaid Other | Admitting: Certified Registered"

## 2019-01-10 ENCOUNTER — Encounter (HOSPITAL_COMMUNITY): Payer: Self-pay

## 2019-01-10 ENCOUNTER — Other Ambulatory Visit: Payer: Self-pay

## 2019-01-10 ENCOUNTER — Observation Stay (HOSPITAL_COMMUNITY)
Admission: RE | Admit: 2019-01-10 | Discharge: 2019-01-11 | Disposition: A | Discharge status: Home / Self Care | Payer: Medicaid Other | Attending: Orthopedic Surgery | Admitting: Orthopedic Surgery

## 2019-01-10 ENCOUNTER — Encounter (HOSPITAL_COMMUNITY)
Admission: RE | Disposition: A | Discharge status: Home / Self Care | Payer: Self-pay | Source: Home / Self Care | Attending: Orthopedic Surgery

## 2019-01-10 DIAGNOSIS — W312XXD Contact with powered woodworking and forming machines, subsequent encounter: Secondary | ICD-10-CM | POA: Insufficient documentation

## 2019-01-10 DIAGNOSIS — M24641 Ankylosis, right hand: Secondary | ICD-10-CM | POA: Diagnosis not present

## 2019-01-10 DIAGNOSIS — M79641 Pain in right hand: Secondary | ICD-10-CM | POA: Diagnosis present

## 2019-01-10 DIAGNOSIS — S62600K Fracture of unspecified phalanx of right index finger, subsequent encounter for fracture with nonunion: Principal | ICD-10-CM | POA: Insufficient documentation

## 2019-01-10 DIAGNOSIS — F17210 Nicotine dependence, cigarettes, uncomplicated: Secondary | ICD-10-CM | POA: Insufficient documentation

## 2019-01-10 HISTORY — PX: OPEN REDUCTION INTERNAL FIXATION (ORIF) METACARPAL: SHX6234

## 2019-01-10 LAB — CREATININE, SERUM
Creatinine, Ser: 0.68 mg/dL (ref 0.61–1.24)
GFR calc Af Amer: >60 mL/min (ref >60)
GFR calc non Af Amer: >60 mL/min (ref >60)

## 2019-01-10 LAB — CBC
HCT: 46.9 % (ref 39.0–52.0)
Hemoglobin: 15 g/dL (ref 13.0–17.0)
MCH: 28.8 pg (ref 26.0–34.0)
MCHC: 32 g/dL (ref 30.0–36.0)
MCV: 90 fL (ref 80.0–100.0)
Platelets: 376 10*3/uL (ref 150–400)
RBC: 5.21 MIL/uL (ref 4.22–5.81)
RDW: 14.6 % (ref 11.5–15.5)
WBC: 8.1 10*3/uL (ref 4.0–10.5)
nRBC: 0 % (ref 0.0–0.2)

## 2019-01-10 SURGERY — OPEN REDUCTION INTERNAL FIXATION (ORIF) METACARPAL
Anesthesia: General | Site: Hand | Laterality: Right

## 2019-01-10 MED ORDER — CEFAZOLIN SODIUM-DEXTROSE 1-4 GM/50ML-% IV SOLN: 1.0000 g | INTRAVENOUS | Status: DC

## 2019-01-10 MED ORDER — ONDANSETRON HCL 4 MG/2ML IJ SOLN: 4.0000 mg | Freq: Four times a day (QID) | INTRAMUSCULAR | Status: DC | PRN

## 2019-01-10 MED ORDER — PROPOFOL 10 MG/ML IV BOLUS
INTRAVENOUS | Status: AC
  Filled 2019-01-10: qty 20

## 2019-01-10 MED ORDER — MIDAZOLAM HCL 2 MG/2ML IJ SOLN
INTRAMUSCULAR | Status: AC
  Administered 2019-01-10: 12:00:00 2 mg via INTRAVENOUS
  Filled 2019-01-10: qty 2

## 2019-01-10 MED ORDER — FENTANYL CITRATE (PF) 100 MCG/2ML IJ SOLN
INTRAMUSCULAR | Status: AC
  Filled 2019-01-10: qty 2

## 2019-01-10 MED ORDER — DEXAMETHASONE SODIUM PHOSPHATE 10 MG/ML IJ SOLN
INTRAMUSCULAR | Status: AC
  Filled 2019-01-10: qty 1

## 2019-01-10 MED ORDER — FAMOTIDINE 20 MG PO TABS
20.0000 mg | ORAL_TABLET | Freq: Two times a day (BID) | ORAL | Status: DC | PRN
  Filled 2019-01-10: qty 1

## 2019-01-10 MED ORDER — CEFAZOLIN SODIUM-DEXTROSE 1-4 GM/50ML-% IV SOLN
1.0000 g | Freq: Three times a day (TID) | INTRAVENOUS | Status: DC
  Administered 2019-01-10 – 2019-01-11 (×2): 1 g via INTRAVENOUS
  Filled 2019-01-10 (×2): qty 50

## 2019-01-10 MED ORDER — MIDAZOLAM HCL 5 MG/5ML IJ SOLN
INTRAMUSCULAR | Status: DC | PRN
  Administered 2019-01-10: 2 mg via INTRAVENOUS

## 2019-01-10 MED ORDER — FENTANYL CITRATE (PF) 250 MCG/5ML IJ SOLN
INTRAMUSCULAR | Status: DC | PRN
  Administered 2019-01-10 (×2): 50 ug via INTRAVENOUS

## 2019-01-10 MED ORDER — BUPIVACAINE-EPINEPHRINE (PF) 0.5% -1:200000 IJ SOLN
INTRAMUSCULAR | Status: DC | PRN
  Administered 2019-01-10: 30 mL via PERINEURAL

## 2019-01-10 MED ORDER — GLYCOPYRROLATE PF 0.2 MG/ML IJ SOSY
PREFILLED_SYRINGE | INTRAMUSCULAR | Status: AC
  Filled 2019-01-10: qty 1

## 2019-01-10 MED ORDER — BUPIVACAINE HCL (PF) 0.25 % IJ SOLN
INTRAMUSCULAR | Status: AC
  Filled 2019-01-10: qty 30

## 2019-01-10 MED ORDER — DOCUSATE SODIUM 100 MG PO CAPS
100.0000 mg | ORAL_CAPSULE | Freq: Two times a day (BID) | ORAL | Status: DC
  Administered 2019-01-10 – 2019-01-11 (×2): 100 mg via ORAL
  Filled 2019-01-10 (×2): qty 1

## 2019-01-10 MED ORDER — 0.9 % SODIUM CHLORIDE (POUR BTL) OPTIME
TOPICAL | Status: DC | PRN
  Administered 2019-01-10: 1000 mL

## 2019-01-10 MED ORDER — ONDANSETRON HCL 4 MG/2ML IJ SOLN
INTRAMUSCULAR | Status: DC | PRN
  Administered 2019-01-10: 4 mg via INTRAVENOUS

## 2019-01-10 MED ORDER — HYDROMORPHONE HCL 1 MG/ML IJ SOLN: 0.5000 mg | INTRAMUSCULAR | Status: DC | PRN

## 2019-01-10 MED ORDER — FENTANYL CITRATE (PF) 100 MCG/2ML IJ SOLN
50.0000 ug | Freq: Once | INTRAMUSCULAR | Status: AC
  Administered 2019-01-10: 12:00:00 50 ug via INTRAVENOUS

## 2019-01-10 MED ORDER — GLYCOPYRROLATE PF 0.2 MG/ML IJ SOSY
PREFILLED_SYRINGE | INTRAMUSCULAR | Status: DC | PRN
  Administered 2019-01-10: .1 mg via INTRAVENOUS

## 2019-01-10 MED ORDER — MIDAZOLAM HCL 2 MG/2ML IJ SOLN
INTRAMUSCULAR | Status: AC
  Filled 2019-01-10: qty 2

## 2019-01-10 MED ORDER — ALPRAZOLAM 0.5 MG PO TABS: 0.5000 mg | ORAL_TABLET | Freq: Four times a day (QID) | ORAL | Status: DC | PRN

## 2019-01-10 MED ORDER — ONDANSETRON HCL 4 MG/2ML IJ SOLN
INTRAMUSCULAR | Status: AC
  Filled 2019-01-10: qty 2

## 2019-01-10 MED ORDER — CEFAZOLIN SODIUM-DEXTROSE 2-4 GM/100ML-% IV SOLN
INTRAVENOUS | Status: AC
  Filled 2019-01-10: qty 100

## 2019-01-10 MED ORDER — OXYCODONE HCL 5 MG/5ML PO SOLN: 5.0000 mg | Freq: Once | ORAL | Status: DC | PRN

## 2019-01-10 MED ORDER — FENTANYL CITRATE (PF) 100 MCG/2ML IJ SOLN: 25.0000 ug | INTRAMUSCULAR | Status: DC | PRN

## 2019-01-10 MED ORDER — PROMETHAZINE HCL 12.5 MG RE SUPP: 12.5000 mg | Freq: Four times a day (QID) | RECTAL | Status: DC | PRN

## 2019-01-10 MED ORDER — SODIUM CHLORIDE 0.9 % IV SOLN
INTRAVENOUS | Status: DC | PRN
  Administered 2019-01-10: 15:00:00 30 ug/min via INTRAVENOUS

## 2019-01-10 MED ORDER — LACTATED RINGERS IV SOLN
INTRAVENOUS | Status: DC
  Administered 2019-01-10: 12:00:00 via INTRAVENOUS

## 2019-01-10 MED ORDER — METHOCARBAMOL 500 MG PO TABS: 500.0000 mg | ORAL_TABLET | Freq: Four times a day (QID) | ORAL | Status: DC | PRN

## 2019-01-10 MED ORDER — DEXAMETHASONE SODIUM PHOSPHATE 10 MG/ML IJ SOLN
INTRAMUSCULAR | Status: DC | PRN
  Administered 2019-01-10: 5 mg via INTRAVENOUS

## 2019-01-10 MED ORDER — LIDOCAINE 2% (20 MG/ML) 5 ML SYRINGE
INTRAMUSCULAR | Status: AC
  Filled 2019-01-10: qty 5

## 2019-01-10 MED ORDER — CEFAZOLIN SODIUM-DEXTROSE 2-4 GM/100ML-% IV SOLN
2.0000 g | INTRAVENOUS | Status: AC
  Administered 2019-01-10: 2 g via INTRAVENOUS

## 2019-01-10 MED ORDER — VITAMIN C 500 MG PO TABS
1000.0000 mg | ORAL_TABLET | Freq: Every day | ORAL | Status: DC
  Administered 2019-01-10 – 2019-01-11 (×2): 1000 mg via ORAL
  Filled 2019-01-10: qty 2

## 2019-01-10 MED ORDER — CHLORHEXIDINE GLUCONATE 4 % EX LIQD: 60.0000 mL | Freq: Once | CUTANEOUS | Status: DC

## 2019-01-10 MED ORDER — ACETAMINOPHEN 500 MG PO TABS
1000.0000 mg | ORAL_TABLET | Freq: Four times a day (QID) | ORAL | Status: DC
  Administered 2019-01-10 – 2019-01-11 (×3): 1000 mg via ORAL
  Filled 2019-01-10 (×3): qty 2

## 2019-01-10 MED ORDER — LIDOCAINE 2% (20 MG/ML) 5 ML SYRINGE
INTRAMUSCULAR | Status: DC | PRN
  Administered 2019-01-10: 40 mg via INTRAVENOUS

## 2019-01-10 MED ORDER — OXYCODONE HCL 5 MG PO TABS: 10.0000 mg | ORAL_TABLET | ORAL | Status: DC | PRN

## 2019-01-10 MED ORDER — CEFAZOLIN SODIUM-DEXTROSE 1-4 GM/50ML-% IV SOLN
1.0000 g | INTRAVENOUS | Status: AC
  Administered 2019-01-10: 19:00:00 1 g via INTRAVENOUS
  Filled 2019-01-10: qty 50

## 2019-01-10 MED ORDER — ONDANSETRON HCL 4 MG PO TABS: 4.0000 mg | ORAL_TABLET | Freq: Four times a day (QID) | ORAL | Status: DC | PRN

## 2019-01-10 MED ORDER — OXYCODONE HCL 5 MG PO TABS: 5.0000 mg | ORAL_TABLET | Freq: Once | ORAL | Status: DC | PRN

## 2019-01-10 MED ORDER — METHOCARBAMOL 1000 MG/10ML IJ SOLN
500.0000 mg | Freq: Four times a day (QID) | INTRAVENOUS | Status: DC | PRN
  Filled 2019-01-10: qty 5

## 2019-01-10 MED ORDER — ACETAMINOPHEN 325 MG PO TABS: 325.0000 mg | ORAL_TABLET | Freq: Four times a day (QID) | ORAL | Status: DC | PRN

## 2019-01-10 MED ORDER — OXYCODONE HCL 5 MG PO TABS: 5.0000 mg | ORAL_TABLET | ORAL | Status: DC | PRN

## 2019-01-10 MED ORDER — PROPOFOL 10 MG/ML IV BOLUS
INTRAVENOUS | Status: DC | PRN
  Administered 2019-01-10: 180 mg via INTRAVENOUS

## 2019-01-10 MED ORDER — MIDAZOLAM HCL 2 MG/2ML IJ SOLN
2.0000 mg | Freq: Once | INTRAMUSCULAR | Status: AC
  Administered 2019-01-10: 2 mg via INTRAVENOUS

## 2019-01-10 MED ORDER — FENTANYL CITRATE (PF) 250 MCG/5ML IJ SOLN
INTRAMUSCULAR | Status: AC
  Filled 2019-01-10: qty 5

## 2019-01-10 MED ORDER — FENTANYL CITRATE (PF) 100 MCG/2ML IJ SOLN
INTRAMUSCULAR | Status: AC
  Administered 2019-01-10: 12:00:00 50 ug via INTRAVENOUS
  Filled 2019-01-10: qty 2

## 2019-01-10 SURGICAL SUPPLY — 63 items
BANDAGE ACE 3X5.8 VEL STRL LF (GAUZE/BANDAGES/DRESSINGS) ×3 IMPLANT
BANDAGE ELASTIC 4 VELCRO ST LF (GAUZE/BANDAGES/DRESSINGS) ×2 IMPLANT
BLADE CLIPPER SURG (BLADE) IMPLANT
BNDG CMPR 9X4 STRL LF SNTH (GAUZE/BANDAGES/DRESSINGS) ×1
BNDG ELASTIC 4X5.8 VLCR STR LF (GAUZE/BANDAGES/DRESSINGS) ×3 IMPLANT
BNDG ESMARK 4X9 LF (GAUZE/BANDAGES/DRESSINGS) ×3 IMPLANT
BNDG GAUZE ELAST 4 BULKY (GAUZE/BANDAGES/DRESSINGS) ×5 IMPLANT
CORD BIPOLAR FORCEPS 12FT (ELECTRODE) ×3 IMPLANT
COVER SURGICAL LIGHT HANDLE (MISCELLANEOUS) ×3 IMPLANT
COVER WAND RF STERILE (DRAPES) ×3 IMPLANT
CUFF TOURN SGL QUICK 18X4 (TOURNIQUET CUFF) ×3 IMPLANT
CUFF TOURN SGL QUICK 24 (TOURNIQUET CUFF)
CUFF TRNQT CYL 24X4X16.5-23 (TOURNIQUET CUFF) IMPLANT
DRAIN TLS ROUND 10FR (DRAIN) IMPLANT
DRAPE OEC MINIVIEW 54X84 (DRAPES) IMPLANT
DRAPE SURG 17X23 STRL (DRAPES) ×3 IMPLANT
DRILL CANN MAX VPC 3.2MM (DRILL) IMPLANT
DRSG ADAPTIC 3X8 NADH LF (GAUZE/BANDAGES/DRESSINGS) ×2 IMPLANT
GAUZE SPONGE 4X4 12PLY STRL (GAUZE/BANDAGES/DRESSINGS) ×3 IMPLANT
GAUZE XEROFORM 1X8 LF (GAUZE/BANDAGES/DRESSINGS) ×3 IMPLANT
GLOVE BIOGEL M 8.0 STRL (GLOVE) ×3 IMPLANT
GLOVE SS BIOGEL STRL SZ 8 (GLOVE) ×1 IMPLANT
GLOVE SUPERSENSE BIOGEL SZ 8 (GLOVE) ×2
GOWN STRL REUS W/ TWL LRG LVL3 (GOWN DISPOSABLE) ×3 IMPLANT
GOWN STRL REUS W/ TWL XL LVL3 (GOWN DISPOSABLE) ×3 IMPLANT
GOWN STRL REUS W/TWL LRG LVL3 (GOWN DISPOSABLE) ×9
GOWN STRL REUS W/TWL XL LVL3 (GOWN DISPOSABLE) ×9
K-WIRE COCR 1.4 X 127 (WIRE) ×3
KIT BASIN OR (CUSTOM PROCEDURE TRAY) ×3 IMPLANT
KIT INFUSE SMALL (Orthopedic Implant) ×2 IMPLANT
KIT TURNOVER KIT B (KITS) ×3 IMPLANT
KWIRE COCR 1.4 X 127 (WIRE) IMPLANT
MANIFOLD NEPTUNE II (INSTRUMENTS) ×3 IMPLANT
MAX VPC CANN DRILL 3.2MM (DRILL) ×3
NEEDLE 22X1 1/2 (OR ONLY) (NEEDLE) IMPLANT
NS IRRIG 1000ML POUR BTL (IV SOLUTION) ×3 IMPLANT
PACK ORTHO EXTREMITY (CUSTOM PROCEDURE TRAY) ×3 IMPLANT
PAD ARMBOARD 7.5X6 YLW CONV (MISCELLANEOUS) ×6 IMPLANT
PAD CAST 3X4 CTTN HI CHSV (CAST SUPPLIES) ×1 IMPLANT
PAD CAST 4YDX4 CTTN HI CHSV (CAST SUPPLIES) ×1 IMPLANT
PADDING CAST COTTON 3X4 STRL (CAST SUPPLIES) ×3
PADDING CAST COTTON 4X4 STRL (CAST SUPPLIES) ×3
PADDING CAST SYNTHETIC 4 (CAST SUPPLIES) ×2
PADDING CAST SYNTHETIC 4X4 STR (CAST SUPPLIES) IMPLANT
PUTTY DBM STAGRAFT PLUS 2CC (Putty) ×2 IMPLANT
SCREW MAX VPC 4.0X34MM (Screw) ×2 IMPLANT
SOL PREP POV-IOD 4OZ 10% (MISCELLANEOUS) ×6 IMPLANT
SPLINT PLASTER CAST XFAST 3X15 (CAST SUPPLIES) IMPLANT
SPLINT PLASTER XTRA FASTSET 3X (CAST SUPPLIES) ×2
SPONGE LAP 4X18 RFD (DISPOSABLE) IMPLANT
SUT BONE WAX W31G (SUTURE) ×2 IMPLANT
SUT MNCRL AB 4-0 PS2 18 (SUTURE) ×3 IMPLANT
SUT PROLENE 3 0 PS 2 (SUTURE) IMPLANT
SUT PROLENE 4 0 PS 2 18 (SUTURE) ×4 IMPLANT
SUT VIC AB 3-0 FS2 27 (SUTURE) IMPLANT
SYR CONTROL 10ML LL (SYRINGE) IMPLANT
SYSTEM CHEST DRAIN TLS 7FR (DRAIN) IMPLANT
TOWEL GREEN STERILE (TOWEL DISPOSABLE) ×3 IMPLANT
TOWEL GREEN STERILE FF (TOWEL DISPOSABLE) ×3 IMPLANT
TUBE CONNECTING 12'X1/4 (SUCTIONS) ×1
TUBE CONNECTING 12X1/4 (SUCTIONS) ×2 IMPLANT
TUBE EVACUATION TLS (MISCELLANEOUS) ×3 IMPLANT
WATER STERILE IRR 1000ML POUR (IV SOLUTION) ×3 IMPLANT

## 2019-01-10 NOTE — H&P (Signed)
Jared Davis is Jared Davis 48 y.o. male.   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 The patient denies neck, back, chest or  abdominal pain. The patient notes that they have no lower extremity problems. The patients primary complaint is noted. We are planning surgical care pathway for the upper extremity.  Past Medical History:  Diagnosis Date  . Smoker     Past Surgical History:  Procedure Laterality Date  . BACK SURGERY    . I&D EXTREMITY Right 11/09/2017   Procedure: IRRIGATION AND DEBRIDEMENT RIGHT HAND REPAIR AS NECESSARY;  Surgeon: Dominica SeverinGramig, Ranen Doolin, MD;  Location: [facility] OR;  Service: Orthopedics;  Laterality: Right;    History reviewed. No pertinent family history. Social History:  reports that he has been smoking cigarettes. He has been smoking about 0.10 packs per day. He has never used smokeless tobacco. He reports current alcohol use. He reports that he does not use drugs.  Allergies: No Known Allergies  Medications Prior to Admission  Medication Sig Dispense Refill  . cyclobenzaprine (FLEXERIL) 10 MG tablet Take 10 mg by mouth every 6 (six) hours as needed for muscle spasms.    Marland Kitchen. oxyCODONE (OXY IR/ROXICODONE) 5 MG immediate release tablet Take 5 mg by mouth every 8 (eight) hours as needed for severe pain.      Results for orders placed or performed during the hospital encounter of 01/10/19 (from the past 48 hour(s))  CBC     Status: None   Collection Time: 01/10/19 12:00 PM  Result Value Ref Range   WBC 8.1 4.0 - 10.5 K/uL   RBC 5.21 4.22 - 5.81 MIL/uL   Hemoglobin 15.0 13.0 - 17.0 g/dL   HCT 91.446.9 78.239.0 - 95.652.0 %   MCV 90.0 80.0 - 100.0 fL   MCH 28.8 26.0 - 34.0 pg   MCHC 32.0 30.0 - 36.0 g/dL   RDW 21.314.6 08.611.5 - 57.815.5 %   Platelets 376 150 - 400  K/uL   nRBC 0.0 0.0 - 0.2 %    Comment: Performed at [facility], [address] [place], [place] [id number]   No results found.  Review of Systems  Respiratory: Negative.   Cardiovascular: Negative.     Blood pressure 110/68, pulse 88, temperature 98.6 F (37 C), temperature source Oral, resp. rate 16, height 5\' 5"  (1.651 m), weight 63.6 kg, SpO2 100 %. 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  I reviewed these issues with him at length and the findings.  He would like to proceed with surgical endeavors as outlined. The patient is alert and oriented in no acute distress. The patient complains of pain in the affected upper extremity.  The patient is noted to have a normal HEENT exam. Lung fields show equal chest expansion and no shortness of breath. Abdomen exam is nontender without distention. Lower extremity examination does not show any fracture dislocation or blood clot symptoms. Pelvis is stable and the neck and back are stable and nontender. Assessment/Plan We will plan for repair reconstruction right index MCP nascent nonunion about a MCP fusion status post  Skil saw/table saw injury as well as right small finger tenolysis  Synovectomy and capsulotomy as necessary with manipulation under anesthesia   We are planning surgery for your upper extremity. The risk and benefits of surgery to include risk of bleeding, infection, anesthesia,  damage to normal structures and failure of the surgery to accomplish its intended goals of relieving symptoms and restoring function have been discussed in detail. With this in mind we plan to proceed. I have specifically discussed with the patient the pre-and postoperative regime and the dos and don'ts and risk and benefits in great detail. Risk and  benefits of surgery also include risk of dystrophy(CRPS), chronic nerve pain, failure of the healing process to go onto completion and other inherent risks of surgery The relavent the pathophysiology of the disease/injury process, as well as the alternatives for treatment and postoperative course of action has been discussed in great detail with the patient who desires to proceed.  We will do everything in our power to help you (the patient) restore function to the upper extremity. It is a pleasure to see this patient today.   Willa Frater III, MD 01/10/2019, 1:55 PM

## 2019-01-10 NOTE — Transfer of Care (Signed)
Immediate Anesthesia Transfer of Care Note  Patient: Jared Davis  Procedure(s) Performed: Right index finger open reduction internal fixation and repair reconstruction with bone grafting including auto and allograft as well as BMP bone morphogenetic protiens, right small finger tendon adhesion release and capsular release (Right Hand)  Patient Location: PACU  Anesthesia Type:GA combined with regional for post-op pain  Level of Consciousness: awake, alert , oriented and patient cooperative  Airway & Oxygen Therapy: Patient Spontanous Breathing and Patient connected to face mask oxygen  Post-op Assessment: Report given to RN and Post -op Vital signs reviewed and stable  Post vital signs: Reviewed and stable  Last Vitals:  Vitals Value Taken Time  BP 117/76 01/10/19 1557  Temp    Pulse 92 01/10/19 1558  Resp 23 01/10/19 1558  SpO2 97 % 01/10/19 1558  Vitals shown include unvalidated device data.  Last Pain:  Vitals:   01/10/19 1200  TempSrc:   PainSc: 0-No pain         Complications: No apparent anesthesia complications

## 2019-01-10 NOTE — Anesthesia Preprocedure Evaluation (Signed)
Anesthesia Evaluation  Patient identified by MRN, date of birth, ID band Patient awake    Reviewed: Allergy & Precautions, H&P , NPO status , Patient's Chart, lab work & pertinent test results  Airway Mallampati: II   Neck ROM: full    Dental   Pulmonary Current Smoker,    breath sounds clear to auscultation       Cardiovascular negative cardio ROS   Rhythm:regular Rate:Normal     Neuro/Psych    GI/Hepatic   Endo/Other    Renal/GU      Musculoskeletal   Abdominal   Peds  Hematology   Anesthesia Other Findings   Reproductive/Obstetrics                             Anesthesia Physical Anesthesia Plan  ASA: II  Anesthesia Plan: General   Post-op Pain Management:  Regional for Post-op pain   Induction: Intravenous  PONV Risk Score and Plan: 1 and Ondansetron, Dexamethasone, Midazolam and Treatment may vary due to age or medical condition  Airway Management Planned: LMA  Additional Equipment:   Intra-op Plan:   Post-operative Plan:   Informed Consent: I have reviewed the patients History and Physical, chart, labs and discussed the procedure including the risks, benefits and alternatives for the proposed anesthesia with the patient or authorized representative who has indicated his/her understanding and acceptance.       Plan Discussed with: CRNA, Anesthesiologist and Surgeon  Anesthesia Plan Comments:         Anesthesia Quick Evaluation

## 2019-01-10 NOTE — Anesthesia Procedure Notes (Signed)
Anesthesia Regional Block: Supraclavicular block   Pre-Anesthetic Checklist: ,, timeout performed, Correct Patient, Correct Site, Correct Laterality, Correct Procedure, Correct Position, site marked, Risks and benefits discussed,  Surgical consent,  Pre-op evaluation,  At surgeon's request and post-op pain management  Laterality: Right  Prep: chloraprep       Needles:  Injection technique: Single-shot  Needle Type: Echogenic Stimulator Needle     Needle Length: 5cm  Needle Gauge: 22     Additional Needles:   Procedures:, nerve stimulator,,,,,,,   Nerve Stimulator or Paresthesia:  Response: biceps flexion, 0.45 mA,   Additional Responses:   Narrative:  Start time: 01/10/2019 12:26 PM End time: 01/10/2019 12:31 PM Injection made incrementally with aspirations every 5 mL.  Performed by: Personally  Anesthesiologist: Albertha Ghee, MD  Additional Notes: Functioning IV was confirmed and monitors were applied.  A 98mm 22ga Arrow echogenic stimulator needle was used. Sterile prep and drape,hand hygiene and sterile gloves were used.  Negative aspiration and negative test dose prior to incremental administration of local anesthetic. The patient tolerated the procedure well.  Ultrasound guidance: relevent anatomy identified, needle position confirmed, local anesthetic spread visualized around nerve(s), vascular puncture avoided.  Image printed for medical record.

## 2019-01-10 NOTE — Anesthesia Procedure Notes (Signed)
Procedure Name: LMA Insertion Date/Time: 01/10/2019 2:07 PM Performed by: Cleda Daub, CRNA Pre-anesthesia Checklist: Patient identified, Emergency Drugs available, Suction available and Patient being monitored Patient Re-evaluated:Patient Re-evaluated prior to induction Oxygen Delivery Method: Circle system utilized Preoxygenation: Pre-oxygenation with 100% oxygen Induction Type: IV induction LMA: LMA inserted LMA Size: 4.0 Placement Confirmation: positive ETCO2 Tube secured with: Tape Dental Injury: Teeth and Oropharynx as per pre-operative assessment

## 2019-01-10 NOTE — Op Note (Signed)
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  Postop diagnosis the same  Procedure: #1 removal deep hardware right small finger MCP joint #2 extensor tendon tenolysis tenosynovectomy right small finger #3 capsulectomy and capsulotomy right small finger PIP joint #4 deep hardware removal right index finger MCP joint #5 arthrodesis revision in nature MCP joint right index finger MCP with BMP bone graft and [employer] headless compression screw standard #6 5 view radiographic series #7 extensor tendon tenolysis tenosynovectomy right index finger   Surgeon Dominica SeverinWilliam Ivi Griffith  Anesthesia General with preoperative block  Estimated blood loss minimal.  Tourniquet time less than Branden hour.  Indications for the procedure this patient is a very pleasant 48 year old male status post table saw injury.  He has nonunion to the MCP joint and dense adhesions throughout with retained hardware.  He is successfully fused the middle finger MCP and the small finger MCP.  His small finger unfortunately is quite stiff and thus were planning a scar tissue tendon adhesion release with joint release.  We will plan for Ramesh exploration of the MCP joint and revision fusion.  Operative procedure in detail: Patient was seen by myself and anesthesia taken to the operative theater underwent a smooth induction of general anesthetic he was prepped with Hibiclens pre-scrub followed by 10-minute surgical Betadine scrub and paint.  Once this was complete the patient had timeout observed the operation commenced with tourniquet insufflation and a longitudinal incision over the small finger.  From just distal to the PIP to the area proximal to the MCP skin flaps were elevated and tendon release was accomplished.  I removed a  prior headless AccuTrack screw without difficulty.  This was a deep hardware removal right small finger MCP joint.  This was checked under x-ray and was solid in terms of the fusion.  Following this the patient underwent tendon adhesion release extensive about the MP region as well as PIP region.  Following this I performed a capsulectomy capsulotomy and synovectomy the PIP joint.  I was able achieve full motion of the DIP and the PIP joint.  Following this we irrigated copiously deflated the tourniquet and ultimately closed the wound with Prolene suture.  The patient tolerated this well and there were no complicating features.  Once this was complete we then performed a longitudinal incision over the index finger dissection was carried down prior plate and screws were removed this was deep hardware removal.  I performed a tendon release and tendon adhesion release.  Tenolysis tenosynovectomy was accomplished without difficulty.  Once this was complete we then performed a very careful and cautious look to the joint.  There is Crystal obvious nonunion I thus resected the nonunion and fibrous material back into good healthy cancellus bone was available.  I then preplaced a guidewire for a headless compression screw from [employer] and preplaced bone graft in the form of BMP infuse bone graft which was presoaked according to standard technique.  Following this we then performed very careful and cautious approach to the fixation with application of a 34 mm headless screw.  Compression of the 2 cancellus surfaces was accomplished without difficulty utilizing the screw placement and I was quite pleased  5 view radiographic series was reviewed performed examined and interpreted by myself and looked to be excellent.  I  was pleased with this.  I then placed additional BMP over the area and closed the extensor tendon with FiberWire.  Following this we then closed the skin with Prolene.  The patient tolerated this well and  there are no complicating features.  Patient was placed in a sterile dressing of Adaptic Xeroform 4 x 4's and a standard splint there were no complicating features.  All sponge needle and instrument counts were reported as correct.  We will look forward to seeing him back in the office in 10 to 14 days begin aggressive motion with therapy.  Our goal will be to hold the index finger completely still at the MP begin gentle PIP motion and really work on whole motion attempts about the middle ring and small of course understanding that the MCP joints are fused.  All questions have been encouraged and answered.  Odetta Forness MD

## 2019-01-11 DIAGNOSIS — S62600K Fracture of unspecified phalanx of right index finger, subsequent encounter for fracture with nonunion: Secondary | ICD-10-CM | POA: Diagnosis not present

## 2019-01-11 MED ORDER — OXYCODONE HCL 5 MG PO TABS: 5.0000 mg | ORAL_TABLET | ORAL | 0 refills | Status: AC | PRN

## 2019-01-11 MED ORDER — CEPHALEXIN 500 MG PO CAPS: 500.0000 mg | ORAL_CAPSULE | Freq: Four times a day (QID) | ORAL | 0 refills | Status: AC

## 2019-01-11 NOTE — Discharge Summary (Signed)
Physician Discharge Summary  Patient ID: Jared Davis MRN: [id number] DOB/AGE: [birth year] 48 y.o.  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 Smoker     Discharge Diagnoses:  Active Problems:   Hand pain, right   Surgeries: Procedure(s): Right index finger open reduction internal fixation and repair reconstruction with bone grafting including auto and allograft as well as BMP bone morphogenetic protiens, right small finger tendon adhesion release and capsular release on 01/10/2019    Consultants:   Discharged Condition: Improved  Hospital Course: Jared Davis is Jared Davis 48 y.o. male who was admitted 01/10/2019 with a chief complaint of No chief complaint on file. 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  They were brought to the operating room on 01/10/2019 and underwent Procedure(s): Right index finger open reduction internal fixation and repair reconstruction with bone grafting including auto and allograft as well as BMP bone morphogenetic protiens, right small finger tendon adhesion release and capsular release.    They were given perioperative antibiotics:  Anti-infectives (From admission, onward)   Start     Dose/Rate Route Frequency Ordered Stop   01/11/19 0000  ceFAZolin (ANCEF) IVPB 1 g/50 mL premix     1 g 100 mL/hr over 30 Minutes Intravenous Every 8 hours 01/10/19 1648     01/11/19 0000  cephALEXin (KEFLEX) 500 MG capsule     500 mg Oral 4 times daily 01/11/19 0705 01/21/19 2359   01/10/19 1800  ceFAZolin (ANCEF) IVPB 1 g/50 mL premix  Status:  Discontinued     1 g 100 mL/hr over 30 Minutes Intravenous NOW 01/10/19 1648 01/10/19 1712   01/10/19 1730  ceFAZolin (ANCEF) IVPB 1 g/50 mL premix     1 g 100 mL/hr over 30 Minutes Intravenous NOW 01/10/19 1712 01/10/19 1915   01/10/19 1200  ceFAZolin (ANCEF) IVPB 2g/100 mL premix     2 g 200 mL/hr over 30 Minutes Intravenous On call to O.R. 01/10/19 1146 01/10/19 1414   01/10/19 1150  ceFAZolin (ANCEF) 2-4 GM/100ML-% IVPB    Note to Pharmacy: Gustavo Lah   : cabinet override      01/10/19 1150 01/10/19 1409    .  They were given sequential compression devices, early ambulation, and Other (comment) for DVT prophylaxis.  Recent vital signs:  Patient Vitals for the past 24 hrs:  BP Temp Temp src Pulse Resp SpO2 Height Weight  01/11/19 0426 107/65 98 F (36.7 C) Oral 88 18 95 % - -  01/10/19 1914 101/61 97.8 F (36.6 C) Oral 71 16 93 % - -  01/10/19 1646 110/74 (!) 97.5 F (36.4 C) Oral 83 18 100 % - -  01/10/19 1627 (!) 94/59 97.6 F (36.4 C) - 76 17 96 % - -  01/10/19 1612 98/64 - - 86 18 96 % - -  01/10/19 1557 117/76 (!) 97.2 F (36.2 C) - 86 (!) 22 100 % - -  01/10/19 1240 110/68 - - 88 16 100 % - -  01/10/19 1235 104/67 - - 74 19 100 % - -  01/10/19 1230 (!) 127/91 - - 76 18 100 % - -  01/10/19 1225 - - - 80 Marland Kitchen)  21 100 % - -  01/10/19 1151 125/76 98.6 F (37 C) Oral 86 18 100 % 5\' 5"  (1.651 m) 63.6 kg  .  Recent laboratory studies: No results found.  Discharge Medications:   Allergies as of 01/11/2019   No Known Allergies     Medication List    TAKE these medications   cephALEXin 500 MG capsule Commonly known as: KEFLEX Take 1 capsule (500 mg total) by mouth 4 (four) times daily for 10 days.   cyclobenzaprine 10 MG tablet Commonly known as: FLEXERIL Take 10 mg by mouth every 6 (six) hours as needed for muscle spasms.   oxyCODONE 5 MG immediate release tablet Commonly known as: Oxy IR/ROXICODONE Take 5 mg by mouth every 8 (eight) hours as needed for severe pain. What changed: Another medication with the same name was added. Make sure you understand  how and when to take each.   oxyCODONE 5 MG immediate release tablet Commonly known as: Roxicodone Take 1 tablet (5 mg total) by mouth every 4 (four) hours as needed. What changed: You were already taking a medication with the same name, and this prescription was added. Make sure you understand how and when to take each.       Diagnostic Studies: No results found.  They benefited maximally from their hospital stay and there were no complications.     Disposition: Discharge disposition: 01-Home or Self Care      Discharge Instructions    Call MD / Call 911   Complete by: As directed    If you experience chest pain or shortness of breath, CALL 911 and be transported to the hospital emergency room.  If you develope a fever above 101 F, pus (white drainage) or increased drainage or redness at the wound, or calf pain, call your surgeon's office.   Constipation Prevention   Complete by: As directed    Drink plenty of fluids.  Prune juice may be helpful.  You may use a stool softener, such as Colace (over the counter) 100 mg twice a day.  Use MiraLax (over the counter) for constipation as needed.   Diet - low sodium heart healthy   Complete by: As directed    Increase activity slowly as tolerated   Complete by: As directed      Follow-up Information    Dominica SeverinGramig, Teryn Gust, MD Follow up in 10 day(s).   Specialty: Orthopedic Surgery Why: We will call for your follow-up appointment in 10 to 14 days Contact information: [address] [place] [id number] [phone]          Status post reconstruction right hand after table saw injury.  This was a revision MCP fusion right index finger and a small finger tendon adhesion release.  Discharge medicines include oxycodone and antibiotics.  We have discussed with the patient all instructions.  We will see him back in the office in 10 days.  He is awake alert and oriented today his block is still in effect and I  discussed with him the issues the resolution of his sensation and motor function and how to prepare and deal with this.  All questions have been addressed and I have discussed this with his nursing staff.  Signed: Dionne AnoWilliam M Ilean Spradlin III 01/11/2019, 7:08 AM

## 2019-01-11 NOTE — Progress Notes (Signed)
Pt discharge education provided at bedside with video translator [place] understanding and denies further questions at this time  Awaiting pt transportation  Will continue to monitor

## 2019-01-11 NOTE — Discharge Instructions (Signed)
Please elevate your upper extremity.  Please keep your index finger still but move your middle ring and small fingers.  Please keep your bandage clean and dry.  You will have some discomfort.  We have phoned in a pain medicine to your pharmacy and antibiotics to take.  Please call us for any problems.  Please keep your bandage clean and dry.  We encourage vitamin C at thousand milligrams a day.  If you take pain medicine you will want to take a stool softener with this as it can have constipation effects.

## 2019-01-11 NOTE — Progress Notes (Signed)
Pt IV removed, catheter intact  Pt has all belongings including supportive pillow  Pt refused wheelchair and ambulated off unit with RN

## 2019-01-11 NOTE — Progress Notes (Signed)
Video interpreter used for care.

## 2019-01-11 NOTE — Anesthesia Postprocedure Evaluation (Signed)
Anesthesia Post Note  Patient: Jared Davis  Procedure(s) Performed: Right index finger open reduction internal fixation and repair reconstruction with bone grafting including auto and allograft as well as BMP bone morphogenetic protiens, right small finger tendon adhesion release and capsular release (Right Hand)     Patient location during evaluation: PACU Anesthesia Type: General and Regional Level of consciousness: awake and alert Pain management: pain level controlled Vital Signs Assessment: post-procedure vital signs reviewed and stable Respiratory status: spontaneous breathing, nonlabored ventilation, respiratory function stable and patient connected to nasal cannula oxygen Cardiovascular status: blood pressure returned to baseline and stable Postop Assessment: no apparent nausea or vomiting Anesthetic complications: no    Last Vitals:  Vitals:   01/10/19 1914 01/11/19 0426  BP: 101/61 107/65  Pulse: 71 88  Resp: 16 18  Temp: 36.6 C 36.7 C  SpO2: 93% 95%    Last Pain:  Vitals:   01/11/19 0426  TempSrc: Oral  PainSc:                  [place]

## 2019-01-12 ENCOUNTER — Encounter (HOSPITAL_COMMUNITY): Payer: Self-pay | Admitting: Orthopedic Surgery

## 2020-04-23 IMAGING — CT CT HAND*R* W/O CM
1 of 10 series · 1 of 14 positions shown, 2 images · non-contrast
Comparison: Plain films right hand 11/09/2017.

CLINICAL DATA: The patient suffered a right hand injury using a
table saw on 11/09/2017. He underwent fusion of the right second,
third and fifth MCP joints 11/10/2017. Continued right hand pain.
Subsequent encounter.

EXAM:
CT OF THE RIGHT HAND WITHOUT CONTRAST
TECHNIQUE: Multidetector CT imaging of the right hand was performed according
to the standard protocol. Multiplanar CT image reconstructions were
also generated.

[Series 9: wrist 1.50 br40 s3 ax axial metal reduction (perso · axial · 0.29mm/px · z∈[-785,-785]mm · 1 of 222 slices shown, 2 images]
[im 1/222  soft-tissue]
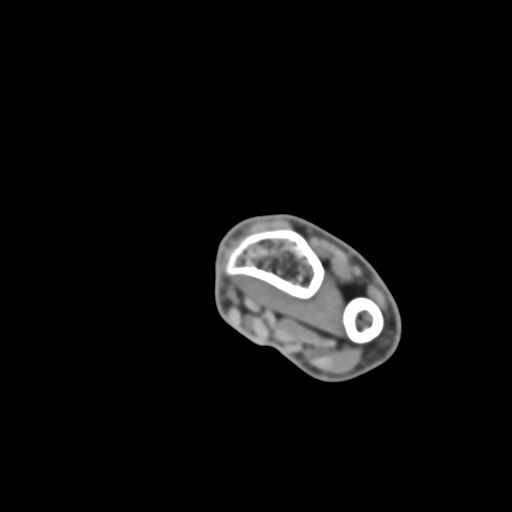
[im 1/222  bone]
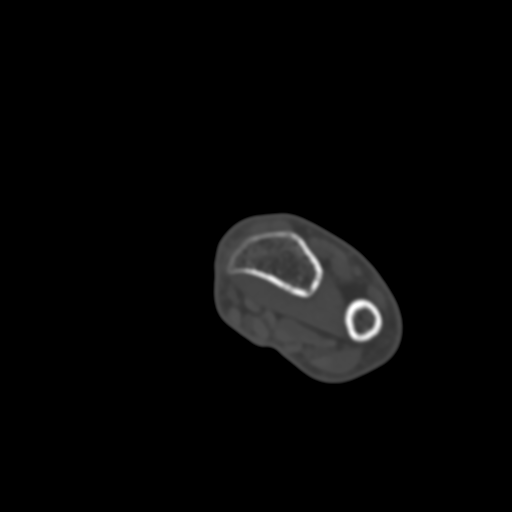

[1 of 14 positions shown; findings below may reference images not displayed]

FINDINGS: Bones/Joint/Cartilage

Dorsal plate and screws are in place across the first MCP joint.
Hardware is intact without evidence of loosening. Alignment is
anatomic. There is no bridging bone across the joint. Fracture of
the head of the second metacarpal is identified and appears largely
healed although there are small bone fragments within the joint and
irregularity of the articular surface. Focus of non bridging callus
extends into the volar soft tissues off the second metacarpal head.

A single screw is in place across the third MCP joint for fusion.
Alignment is anatomic. Hardware is intact without evidence of
loosening. Bridging bone extending approximately 0.3 cm transverse
is seen across the dorsal, radial margin of the joint.

A nondisplaced and incomplete fracture is seen through the dorsal
aspect of the head of the fourth metacarpal. The fracture does not
impact the articular surface.

A single screw is in place across the fifth MCP joint for fusion.
Alignment is anatomic. Hardware is intact without evidence of
loosening. Solid bridging bone is seen about the joint.

No acute bony abnormality is seen.

Ligaments

Suboptimally assessed by CT.

Muscles and Tendons

Intact.

Soft tissues

No fluid collection or mass.  Negative.
IMPRESSION: Status post fusion of the [DATE] and fifth MCP joints.
Alignment is anatomic at each joint and hardware is intact without
evidence of loosening. No bridging bone is seen across the second
MCP joint and the second metacarpal head is mildly fragmented and
irregular. There is some bridging bone across the third MCP joint
and solid fusion across the fifth MCP joint.

Incomplete and nondisplaced fracture through the dorsal aspect of
the fourth metacarpal head does not impact the articular surface.
